# Patient Record
Sex: Male | Born: 1955 | Race: White | Hispanic: No | Marital: Married | Smoking: Never smoker
Health system: Southern US, Community
[De-identification: ages and names within clinical notes are randomized; demographics above are authoritative.]

## PROBLEM LIST (undated history)

## (undated) DIAGNOSIS — Z8719 Personal history of other diseases of the digestive system: Secondary | ICD-10-CM

## (undated) DIAGNOSIS — C801 Malignant (primary) neoplasm, unspecified: Secondary | ICD-10-CM

## (undated) DIAGNOSIS — Z9889 Other specified postprocedural states: Secondary | ICD-10-CM

## (undated) DIAGNOSIS — K219 Gastro-esophageal reflux disease without esophagitis: Secondary | ICD-10-CM

## (undated) DIAGNOSIS — K222 Esophageal obstruction: Secondary | ICD-10-CM

## (undated) HISTORY — PX: PROSTATECTOMY: SHX69

## (undated) SURGERY — EGD (ESOPHAGOGASTRODUODENOSCOPY)
Anesthesia: Moderate Sedation

---
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
## 1978-10-17 HISTORY — PX: HERNIA REPAIR: SHX51

## 1997-10-30 ENCOUNTER — Encounter: Payer: Self-pay | Admitting: Gastroenterology

## 1997-10-30 ENCOUNTER — Ambulatory Visit (HOSPITAL_COMMUNITY): Admission: RE | Admit: 1997-10-30 | Discharge: 1997-10-30 | Payer: Self-pay | Admitting: Gastroenterology

## 1997-11-13 ENCOUNTER — Encounter: Payer: Self-pay | Admitting: Gastroenterology

## 1997-11-13 ENCOUNTER — Ambulatory Visit (HOSPITAL_COMMUNITY): Admission: RE | Admit: 1997-11-13 | Discharge: 1997-11-13 | Payer: Self-pay | Admitting: Gastroenterology

## 2000-04-07 ENCOUNTER — Ambulatory Visit (HOSPITAL_COMMUNITY): Admission: RE | Admit: 2000-04-07 | Discharge: 2000-04-07 | Payer: Self-pay | Admitting: Gastroenterology

## 2000-04-07 ENCOUNTER — Encounter: Payer: Self-pay | Admitting: Gastroenterology

## 2003-01-04 ENCOUNTER — Emergency Department (HOSPITAL_COMMUNITY): Admission: EM | Admit: 2003-01-04 | Discharge: 2003-01-04 | Payer: Self-pay | Admitting: Emergency Medicine

## 2006-05-16 ENCOUNTER — Ambulatory Visit (HOSPITAL_COMMUNITY): Admission: RE | Admit: 2006-05-16 | Discharge: 2006-05-16 | Payer: Self-pay | Admitting: Gastroenterology

## 2008-09-04 ENCOUNTER — Ambulatory Visit (HOSPITAL_COMMUNITY): Admission: RE | Admit: 2008-09-04 | Discharge: 2008-09-04 | Payer: Self-pay | Admitting: Gastroenterology

## 2008-10-15 ENCOUNTER — Ambulatory Visit (HOSPITAL_COMMUNITY): Admission: RE | Admit: 2008-10-15 | Discharge: 2008-10-15 | Payer: Self-pay | Admitting: Gastroenterology

## 2008-11-22 ENCOUNTER — Ambulatory Visit (HOSPITAL_COMMUNITY): Admission: RE | Admit: 2008-11-22 | Discharge: 2008-11-22 | Payer: Self-pay | Admitting: Urology

## 2009-01-15 HISTORY — PX: OTHER SURGICAL HISTORY: SHX169

## 2009-01-22 ENCOUNTER — Encounter (INDEPENDENT_AMBULATORY_CARE_PROVIDER_SITE_OTHER): Payer: Self-pay | Admitting: Urology

## 2009-01-22 ENCOUNTER — Inpatient Hospital Stay (HOSPITAL_COMMUNITY): Admission: RE | Admit: 2009-01-22 | Discharge: 2009-01-23 | Payer: Self-pay | Admitting: Urology

## 2010-05-19 LAB — URINALYSIS, ROUTINE W REFLEX MICROSCOPIC
Hgb urine dipstick: NEGATIVE
Nitrite: NEGATIVE
Protein, ur: NEGATIVE mg/dL
Urobilinogen, UA: 0.2 mg/dL (ref 0.0–1.0)

## 2010-05-19 LAB — CBC
HCT: 44 % (ref 39.0–52.0)
Hemoglobin: 12.9 g/dL — ABNORMAL LOW (ref 13.0–17.0)
Hemoglobin: 13.9 g/dL (ref 13.0–17.0)
MCHC: 31.6 g/dL (ref 30.0–36.0)
MCHC: 31.9 g/dL (ref 30.0–36.0)
MCV: 77.2 fL — ABNORMAL LOW (ref 78.0–100.0)
MCV: 77.3 fL — ABNORMAL LOW (ref 78.0–100.0)
Platelets: 274 10*3/uL (ref 150–400)
Platelets: 285 10*3/uL (ref 150–400)
RBC: 5.23 MIL/uL (ref 4.22–5.81)
RDW: 17.2 % — ABNORMAL HIGH (ref 11.5–15.5)
RDW: 17.6 % — ABNORMAL HIGH (ref 11.5–15.5)

## 2010-05-19 LAB — ABO/RH: ABO/RH(D): O NEG

## 2010-05-19 LAB — COMPREHENSIVE METABOLIC PANEL
Alkaline Phosphatase: 67 U/L (ref 39–117)
BUN: 9 mg/dL (ref 6–23)
Calcium: 9.1 mg/dL (ref 8.4–10.5)
Creatinine, Ser: 1.06 mg/dL (ref 0.4–1.5)
Glucose, Bld: 99 mg/dL (ref 70–99)
Total Protein: 6.5 g/dL (ref 6.0–8.3)

## 2010-05-19 LAB — BASIC METABOLIC PANEL
BUN: 5 mg/dL — ABNORMAL LOW (ref 6–23)
CO2: 29 mEq/L (ref 19–32)
Calcium: 8.4 mg/dL (ref 8.4–10.5)
Chloride: 105 mEq/L (ref 96–112)
Creatinine, Ser: 1.03 mg/dL (ref 0.4–1.5)
GFR calc Af Amer: >60 mL/min (ref >60)
GFR calc non Af Amer: >60 mL/min (ref >60)
Glucose, Bld: 116 mg/dL — ABNORMAL HIGH (ref 70–99)
Sodium: 139 mEq/L (ref 135–145)

## 2010-05-19 LAB — DIFFERENTIAL
Basophils Absolute: 0 10*3/uL (ref 0.0–0.1)
Basophils Relative: 0 % (ref 0–1)
Eosinophils Absolute: 0 10*3/uL (ref 0.0–0.7)
Eosinophils Relative: 0 % (ref 0–5)
Lymphocytes Relative: 15 % (ref 12–46)
Monocytes Absolute: 0.1 10*3/uL (ref 0.1–1.0)
Monocytes Relative: 2 % — ABNORMAL LOW (ref 3–12)
Neutro Abs: 5.7 10*3/uL (ref 1.7–7.7)

## 2010-05-19 LAB — PROTIME-INR
INR: 1.02 (ref 0.00–1.49)
Prothrombin Time: 13.3 seconds (ref 11.6–15.2)

## 2010-05-19 LAB — CREATININE, FLUID (PLEURAL, PERITONEAL, JP DRAINAGE): Creat, Fluid: 1 mg/dL

## 2010-05-19 LAB — APTT: aPTT: 29 seconds (ref 24–37)

## 2010-06-30 NOTE — Op Note (Signed)
NAMEKAELOB, PERSKY               ACCOUNT NO.:  [id number]   MEDICAL RECORD NO.:  [id number]          PATIENT TYPE:  AMB   LOCATION:  ENDO                         FACILITY:  [facility]   PHYSICIAN:  James L. Malon Kindle., M.D.DATE OF BIRTH:  25-Apr-1955   DATE OF PROCEDURE:  10/15/2008  DATE OF DISCHARGE:                               OPERATIVE REPORT   PROCEDURE:  Esophagogastroduodenoscopy and esophageal dilatation.   MEDICATIONS:  Cetacaine spray, fentanyl 100 mcg, Versed 9 mg IV.   INDICATIONS:  The patient has had a history of peptic stricture.  He was  last dilated to 12 mm with some heme.  He has been doing better.  We  decided to go ahead and repeat the dilatation.  He has been on Prilosec.   DESCRIPTION OF PROCEDURE:  Procedure including the risks and benefits  were discussed again with the patient.  No fluoroscopy unit on the Endo  unit. The patient was sedated and the Pentax upper scope the adult size  was inserted blindly into the esophagus and advanced into the esophagus.  There was a stricture in the distal esophagus.  With gentle pressure we  were able to pass and the complete endoscopy was performed.  The  duodenum including the bulb and second portion were seen well.  The  pyloric channel, antrum and body were normal.  Fundus and cardia seen  well on retroflexed view.  The scope was withdrawn and then advanced  back down to the pyloric area.  The Savary guidewire was placed through  the scope in the scope withdrawn over the guide wire.  Unfortunately the  fluoroscopy unit was not working and they had go get another fluoroscopy  unit which they were able to obtain and then we went ahead with the  dilatation under fluoroscopic control.  A total of 36 seconds of fluoro  time was used.  We passed the 12, 12.8, 14 and 15 mm Savary dilator  using fluoroscopic guidance to observe that it crossed the diaphragm.  There was gentle resistance with the 15 but no heme.  At this point,  the  dilator and guidewire withdrawn as a unit.  The patient tolerated the  procedure well.  There were no immediate complications.   ASSESSMENT:  Esophageal stricture dilated to 15 mm.   PLAN:  Routine post dilatation orders.  Will recommend repeating  procedure p.r.n. will have him remain on Prilosec 20 mg daily  indefinitely.           ______________________________  Llana Aliment Malon Kindle., M.D.     Waldron Session  D:  10/15/2008  T:  10/15/2008  Job:  [id number]   cc:   Armstead Bouche, M.D.  Fax: [phone]

## 2010-06-30 NOTE — Op Note (Signed)
NAMECLEON, SIGNORELLI               ACCOUNT NO.:  [id number]   MEDICAL RECORD NO.:  [id number]          PATIENT TYPE:  AMB   LOCATION:  ENDO                         FACILITY:  [facility]   PHYSICIAN:  James L. Malon Kindle., M.D.DATE OF BIRTH:  Mar 09, 1955   DATE OF PROCEDURE:  09/04/2008  DATE OF DISCHARGE:                               OPERATIVE REPORT   PROCEDURE:  Esophagoscopy with Savary dilatation using fluoroscopic  guidance.   MEDICATIONS:  Cetacaine spray, fentanyl 60 mcg, Versed 6 mg IV.   INDICATIONS:  The patient has had previous esophageal stricture dilated  in 2008.  He has had problems swallowing.  He had stopped his proton  pump inhibitor.   DESCRIPTION OF PROCEDURE:  The procedure had been explained to the  patient and consent obtained.  In the left lateral decubitus position in  the endoscopy unit, the Pentax scope was passed.  We reached the distal  esophagus.  There was a tight stricture and we were unable to pass the  scope.  Using fluoroscopic guidance, a Savary guidewire was placed.  It  was seen to go along the greater curve of the stomach.  We then dilated  with the 10-mm Savary dilator.  The guidewire apparently had gotten  pulled back due to kinks in the guidewire, so the dilator was removed  and I re-inserted the scope back to the stricture.  There was a slight  amount of heme.  We passed again the Savary wire through the stricture  into the stomach using fluoroscopic guidance.  Then in a sequential  manner, I passed an 11 and 12-mm with slight heme on the 12-mm dilator.  The 12-mm dilator and guidewire were removed as a unit.  The patient  tolerated the procedure well.  There were no immediate complications.   ASSESSMENT:  Esophageal stricture dilated to 12 mm.   PLAN:  Routine post-dilatation orders.  Will recommend daily omeprazole  and repeat procedure in 2 to 3 weeks.           ______________________________  Llana Aliment Malon Kindle., M.D.     Waldron Session  D:  09/04/2008  T:  09/05/2008  Job:  [id number]   cc:   Melida Quitter, M.D.  Fax: [phone]

## 2010-07-03 NOTE — Procedures (Signed)
[facility]  Patient:    Curtis Dorsey, Curtis Dorsey                      MRN: [id number] Proc. Date: 04/07/00 Adm. Date:  [id number] Attending:  Orland Mustard CC:         Arsenio Loader, M.D.   Procedure Report  PROCEDURES PERFORMED:  Esophagoscopy with Savary dilatation.  INDICATIONS:  Nice gentleman with previous history of a stricture dilated essentially three years ago, now has not had any heartburn or been on an acid suppression, but has begun to have increasing symptoms of dysphagia. For this reason, a repeat dilation is arranged. The patient was informed again regarding the procedure and the potentials and risks and benefits were discussed briefly, again, prior to the procedure.  DESCRIPTION OF PROCEDURE:  The procedure was performed in the fluoroscopy unit and the Olympus adult videoscope was inserted and advanced under direct visualization. The distal esophagus was reached; there was a stricture that the scope would not pass with gentle pressure. The area was fairly inflamed but was not frankly ulcerated. The scope would not pass, so I went ahead and under fluoroscopic guidance passed the Savary guidewire into the stomach. We then withdrew the scope using fluoroscopic guidance. I then passed a #33, #36, #39 and #42 [place] Savary dilators in the usual manner with the patients head extended. There was some heme with the #42 [place] dilator. The scope was withdrawn. The patient tolerated the procedure well. There were no immediate complications.  ASSESSMENT:  Esophageal stricture successfully dilated to #42 [place].  PLAN:  Will start him on acid reduction with Aciphex. We will see him back in the office in three months. Will give routine post-dilatation instructions with instructions to call for chest pain, shortness of breath, etc. DD:  04/07/00 TD:  04/08/00 Job: [id number]

## 2010-07-03 NOTE — Op Note (Signed)
Curtis Dorsey, Curtis Dorsey               ACCOUNT NO.:  [id number]   MEDICAL RECORD NO.:  [id number]          PATIENT TYPE:  AMB   LOCATION:  ENDO                         FACILITY:  [facility]   PHYSICIAN:  James L. Malon Kindle., M.D.DATE OF BIRTH:  1955/06/14   DATE OF PROCEDURE:  05/16/2006  DATE OF DISCHARGE:                               OPERATIVE REPORT   PROCEDURE:  Esophagoscopy with dilation of esophageal stricture.   MEDICATIONS:  Fentanyl 75 mcg, Versed 7.5 mg IV.   INDICATIONS:  A gentleman who has been dilated several times in the  past.  The last dilatation was to 80 [place] in 2002.  He is not on any  medicine for acid.  He has peptic stricture.  He has not had any reflux  and is having problems swallowing.  Called up and dilation was set up.   DESCRIPTION OF PROCEDURE:  The procedure, including the risks and  benefits, were discussed with him again.  He has had it done several  times previously.  This performed in the endoscopy unit using  fluoroscopic guidance.  The Pentax scope was inserted and passed.  The  distal esophagus was reached.  There was a stricture.  It was quite  tight.  The scope would not pass at all.  I then, using fluoroscopic  guidance, passed a Savary guide wire through the stricture and it was  seen to lay along the greater curve in the stomach.  We then withdrew  the scope over the guide wire.  We then extended the patient's neck and  started with a 24-French Savary dilator since I did not know quite how  tight this stricture was.  This was passed under fluoroscopic guidance  in the usual manner.  In a like manner 27, 30, 33, and 36-French  dilators were passed.  There was no __________ , but some resistance  felt with a 36-French dilator.   ASSESSMENT:  Esophageal stricture, probably peptic stricture, dilated to  36 [place].   PLAN:  Will start the patient on omeprazole daily and will see back in  the office in 6 weeks.  He likely will need further  dilatation.           ______________________________  Llana Aliment Malon Kindle., M.D.     Waldron Session  D:  05/16/2006  T:  05/16/2006  Job:  [id number]   cc:   Stacie Acres. Cliffton Asters, M.D.

## 2012-07-06 ENCOUNTER — Encounter (INDEPENDENT_AMBULATORY_CARE_PROVIDER_SITE_OTHER): Payer: Self-pay | Admitting: Surgery

## 2012-07-06 ENCOUNTER — Ambulatory Visit (INDEPENDENT_AMBULATORY_CARE_PROVIDER_SITE_OTHER): Payer: 59 | Admitting: Surgery

## 2012-07-06 VITALS — BP 132/78 | HR 68 | Temp 97.8°F | Resp 18 | Ht 72.0 in | Wt 195.6 lb

## 2012-07-06 DIAGNOSIS — K4090 Unilateral inguinal hernia, without obstruction or gangrene, not specified as recurrent: Secondary | ICD-10-CM

## 2012-07-06 DIAGNOSIS — Z8546 Personal history of malignant neoplasm of prostate: Secondary | ICD-10-CM

## 2012-07-06 NOTE — Patient Instructions (Signed)

## 2012-07-06 NOTE — Progress Notes (Signed)
Chief Complaint:  Symptomatic right inguinal hernia  History of Present Illness:  Curtis Dorsey is an 57 y.o. male on whom I have previously performed a left inguinal hernia in the 1980s and he presented to [facility] in 2004 with a new right inguinal hernia.  I discussed laparoscopic repair at that time. He subsequently had a robotic prostatectomy by Dr. Gaynelle Arabian and 2010. He has done well from that. We discussed repair; the the pre-peritoneal space had been dissected for his prostatectomy and I think that for that reason a better repair could be offered him anteriorly. I would therefore propose an open right inguinal hernia with mesh. I explained the procedure to him and gave him a booklet on this. He is aware of the risks and has gone through the same procedure on the left side. I will try to set this up over [facility] under general anesthesia.  No past medical history on file.  Past Surgical History  Procedure Laterality Date  . Hernia repair  1980 s    Dr. Daphine Deutscher  . 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 Monico Blitz onc  . Tonsillectomy and adenoidectomy  1963    Current Outpatient Prescriptions  Medication Sig Dispense Refill  . ibuprofen (ADVIL,MOTRIN) 200 MG tablet Take 200 mg by mouth every 6 (six) hours as needed for pain.      Marland Kitchen omeprazole (PRILOSEC) 20 MG capsule Take 20 mg by mouth daily.      . phenylephrine (SUDAFED PE) 10 MG TABS Take 10 mg by mouth every 4 (four) hours as needed.       No current facility-administered medications for this visit.   Review of patient's allergies indicates no known allergies. Family History  Problem Relation Age of Onset  . Cancer Mother     colohn ca  . Parkinson's disease Father   . Cancer Paternal Grandmother     colon ca   Social History:   has no tobacco, alcohol, and drug history on file.   REVIEW OF SYSTEMS - PERTINENT POSITIVES ONLY: Otherwise healthy  Physical Exam:   Blood pressure 132/78, pulse 68, temperature 97.8 F  (36.6 C), resp. rate 18, height 6' (1.829 m), weight 195 lb 9.6 oz (88.724 kg). Body mass index is 26.52 kg/(m^2).  Gen:  WDWN WM NAD  Neurological: Alert and oriented to person, place, and time. Motor and sensory function is grossly intact  Head: Normocephalic and atraumatic.  Eyes: Conjunctivae are normal. Pupils are equal, round, and reactive to light. No scleral icterus.  Neck: Normal range of motion. Neck supple. No tracheal deviation or thyromegaly present.  Cardiovascular:  SR without murmurs or gallops.  No carotid bruits Respiratory: Effort normal.  No respiratory distress. No chest wall tenderness. Breath sounds normal.  No wheezes, rales or rhonchi.  Abdomen:  Prominent RIH.  No left inguinal hernia GU: Musculoskeletal: Normal range of motion. Extremities are nontender. No cyanosis, edema or clubbing noted Lymphadenopathy: No cervical, preauricular, postauricular or axillary adenopathy is present Skin: Skin is warm and dry. No rash noted. No diaphoresis. No erythema. No pallor. Pscyh: Normal mood and affect. Behavior is normal. Judgment and thought content normal.   LABORATORY RESULTS: No results found for this or any previous visit (from the past 48 hour(s)).  RADIOLOGY RESULTS: No results found.  Problem List: There are no active problems to display for this patient.   Assessment & Plan: Right inguinal hernia.  Plan open RIH with mesh at  CDS    Matt B. Daphine Deutscher, MD, [facility] [phone] beeper [phone]  07/06/2012 10:35 AM

## 2012-09-01 ENCOUNTER — Ambulatory Visit (HOSPITAL_COMMUNITY)
Admission: RE | Admit: 2012-09-01 | Discharge: 2012-09-01 | Disposition: A | Discharge status: Home / Self Care | Payer: 59 | Source: Ambulatory Visit | Attending: Gastroenterology | Admitting: Gastroenterology

## 2012-09-01 ENCOUNTER — Ambulatory Visit (HOSPITAL_COMMUNITY): Payer: 59

## 2012-09-01 ENCOUNTER — Encounter (HOSPITAL_COMMUNITY): Payer: Self-pay | Admitting: *Deleted

## 2012-09-01 ENCOUNTER — Other Ambulatory Visit: Payer: Self-pay | Admitting: Gastroenterology

## 2012-09-01 ENCOUNTER — Encounter (HOSPITAL_COMMUNITY)
Admission: RE | Disposition: A | Discharge status: Home / Self Care | Payer: Self-pay | Source: Ambulatory Visit | Attending: Gastroenterology

## 2012-09-01 DIAGNOSIS — Z87891 Personal history of nicotine dependence: Secondary | ICD-10-CM | POA: Insufficient documentation

## 2012-09-01 DIAGNOSIS — Z800 Family history of malignant neoplasm of digestive organs: Secondary | ICD-10-CM | POA: Insufficient documentation

## 2012-09-01 DIAGNOSIS — K222 Esophageal obstruction: Secondary | ICD-10-CM | POA: Insufficient documentation

## 2012-09-01 DIAGNOSIS — Z8546 Personal history of malignant neoplasm of prostate: Secondary | ICD-10-CM | POA: Insufficient documentation

## 2012-09-01 DIAGNOSIS — Z9079 Acquired absence of other genital organ(s): Secondary | ICD-10-CM | POA: Insufficient documentation

## 2012-09-01 DIAGNOSIS — K219 Gastro-esophageal reflux disease without esophagitis: Secondary | ICD-10-CM | POA: Insufficient documentation

## 2012-09-01 HISTORY — DX: Other specified postprocedural states: Z98.890

## 2012-09-01 HISTORY — DX: Esophageal obstruction: K22.2

## 2012-09-01 HISTORY — DX: Gastro-esophageal reflux disease without esophagitis: K21.9

## 2012-09-01 HISTORY — DX: Malignant (primary) neoplasm, unspecified: C80.1

## 2012-09-01 HISTORY — PX: ESOPHAGOGASTRODUODENOSCOPY: SHX5428

## 2012-09-01 HISTORY — PX: SAVORY DILATION: SHX5439

## 2012-09-01 HISTORY — DX: Personal history of other diseases of the digestive system: Z87.19

## 2012-09-01 SURGERY — EGD (ESOPHAGOGASTRODUODENOSCOPY)
Anesthesia: Moderate Sedation

## 2012-09-01 MED ORDER — SODIUM CHLORIDE 0.9 % IV SOLN: INTRAVENOUS | Status: DC

## 2012-09-01 MED ORDER — MIDAZOLAM HCL 10 MG/2ML IJ SOLN
INTRAMUSCULAR | Status: DC | PRN
  Administered 2012-09-01 (×4): 2 mg via INTRAVENOUS

## 2012-09-01 MED ORDER — FENTANYL CITRATE 0.05 MG/ML IJ SOLN
INTRAMUSCULAR | Status: AC
  Filled 2012-09-01: qty 4

## 2012-09-01 MED ORDER — DIPHENHYDRAMINE HCL 50 MG/ML IJ SOLN
INTRAMUSCULAR | Status: DC | PRN
  Administered 2012-09-01: 25 mg via INTRAVENOUS

## 2012-09-01 MED ORDER — MIDAZOLAM HCL 10 MG/2ML IJ SOLN
INTRAMUSCULAR | Status: AC
  Filled 2012-09-01: qty 4

## 2012-09-01 MED ORDER — DIPHENHYDRAMINE HCL 50 MG/ML IJ SOLN
INTRAMUSCULAR | Status: AC
  Filled 2012-09-01: qty 1

## 2012-09-01 MED ORDER — FENTANYL CITRATE 0.05 MG/ML IJ SOLN
INTRAMUSCULAR | Status: DC | PRN
  Administered 2012-09-01 (×4): 25 ug via INTRAVENOUS

## 2012-09-01 MED ORDER — BUTAMBEN-TETRACAINE-BENZOCAINE 2-2-14 % EX AERO
INHALATION_SPRAY | CUTANEOUS | Status: DC | PRN
  Administered 2012-09-01: 2 via TOPICAL

## 2012-09-01 NOTE — H&P (Signed)
  Subjective:   Patient is a 57 y.o. male presents with worsening dysphagia. He has had a previous history of esophageal stricture is dilated about 4 years ago.. Procedure including risks and benefits discussed in office.  Patient Active Problem List   Diagnosis Date Noted  . H/O prostate cancer-post robotic prostatectomy 2010 07/06/2012   Past Medical History  Diagnosis Date  . GERD (gastroesophageal reflux disease)   . H/O hiatal hernia   . Cancer     prostate 2010  . Esophageal stricture   . Hx of inguinal hernia surgery     right; will have surgery 09/13/2012    Past Surgical History  Procedure Laterality Date  . 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 Monico Blitz onc, removed   . Tonsillectomy and adenoidectomy  1963  . Hernia repair  1980 s    Dr. Daphine Deutscher  . Prostatectomy      Prescriptions prior to admission  Medication Sig Dispense Refill  . ibuprofen (ADVIL,MOTRIN) 200 MG tablet Take 200 mg by mouth every 6 (six) hours as needed for pain.      . magnesium 30 MG tablet Take 30 mg by mouth as needed.      Marland Kitchen omeprazole (PRILOSEC) 20 MG capsule Take 20 mg by mouth daily.      . phenylephrine (SUDAFED PE) 10 MG TABS Take 10 mg by mouth every 4 (four) hours as needed.       No Known Allergies  History  Substance Use Topics  . Smoking status: Former Games developer  . Smokeless tobacco: Never Used  . Alcohol Use: Yes     Comment: 3-4 drinks a day    Family History  Problem Relation Age of Onset  . Cancer Mother     colohn ca  . Parkinson's disease Father   . Cancer Paternal Grandmother     colon ca     Objective:   Patient Vitals for the past 8 hrs:  BP Temp Temp src Resp SpO2 Height Weight  09/01/12 1203 137/98 mmHg 98.2 F (36.8 C) Oral 13 98 % 6' (1.829 m) 87.544 kg (193 lb)         See MD Preop evaluation      Assessment:   1. Dysphagia probably due to esophageal stricture  Plan:   1. Will plan EGD and Savary dilatation. The procedures been discussed with the  patient in detail in the office including the risk and benefits.

## 2012-09-01 NOTE — Op Note (Signed)
[facility] [address] [place], [address]   ENDOSCOPY PROCEDURE REPORT  PATIENT: Curtis, Dorsey  MR#: [id number] BIRTHDATE: December 20, 1955 , 57  yrs. old GENDER: Male ENDOSCOPIST:Ritaj Dullea Randa Evens, MD REFERRED BY: Dr. Leonides Sake PROCEDURE DATE:  09/01/2012 PROCEDURE:      Esophagoscopy with Gaspar Bidding Dilation ASA CLASS:    class 2 INDICATIONS:   worsening dysphagia with history of prior dilation of esophageal stricture MEDICATION:   Benadryl 25 mg, fentanyl 100 mcg, versed 8 mg IV TOPICAL ANESTHETIC:   cetacaine spray  DESCRIPTION OF PROCEDURE:   ThePentax upper endoscope was inserted into the esophagus with swallowing. We advanced into the distal esophagus and there was a fairly tight esophageal stricture. Endoscope would not pass. We then passed a Savary wire through the stricture into the stomach with fluoroscopic guidance. The scope was withdrawn over the guidewire. The patient's neck was an extended and using fluoroscopic guidance to make sure that the guidewire did not move, I passed 12.8 mm, 14 mm, 15 mm, and 16mm dilator's. Small amount of heme was seen on the 16mm dilator in the dilator and guidewire were withdrawn. 1.3 minutes of flouro time was used. There were no immediate complications     COMPLICATIONS: None  ENDOSCOPIC IMPRESSION: 1. Esophageal stricture. Dilated to 16 mm  RECOMMENDATIONS: 1. Routine post dilatation orders 2.We'll continue current medications .3. We will repeat, PRN basis    _______________________________ eSigned:  Carman Ching, MD 09/01/2012 1:48 PM

## 2012-09-04 ENCOUNTER — Encounter (HOSPITAL_COMMUNITY): Payer: Self-pay | Admitting: Gastroenterology

## 2012-09-04 ENCOUNTER — Encounter (HOSPITAL_COMMUNITY): Payer: Self-pay | Admitting: Pharmacy Technician

## 2012-09-05 NOTE — Patient Instructions (Signed)
Curtis Dorsey  09/05/2012   Your procedure is scheduled on:  09/13/12              Surgery 0830am-1005am  Report to [facility] at    0630 AM.  Call this number if you have problems the morning of surgery: [phone]   Remember:   Do not eat food or drink liquids after midnight.   Take these medicines the morning of surgery with A SIP OF WATER:    Do not wear jewelry,  Do not wear lotions, powders, or perfumes.  . Men may shave face and neck.  Do not bring valuables to the hospital.  Contacts, dentures or bridgework may not be worn into surgery.     Patients discharged the day of surgery will not be allowed to drive  home.  Name and phone number of your driver:    SEE CHG INSTRUCTION SHEET    Please read over the following fact sheets that you were given:  coughing and deep breathing exercises, leg exercises               Failure to comply with these instructions may result in cancellation of your surgery.                Patient Signature ____________________________              Nurse Signature _____________________________

## 2012-09-06 ENCOUNTER — Encounter (HOSPITAL_COMMUNITY)
Admission: RE | Admit: 2012-09-06 | Discharge: 2012-09-06 | Disposition: A | Discharge status: Home / Self Care | Payer: 59 | Source: Ambulatory Visit | Attending: Surgery | Admitting: Surgery

## 2012-09-06 ENCOUNTER — Encounter (HOSPITAL_COMMUNITY): Payer: Self-pay

## 2012-09-06 DIAGNOSIS — Z01812 Encounter for preprocedural laboratory examination: Secondary | ICD-10-CM | POA: Insufficient documentation

## 2012-09-06 DIAGNOSIS — K4090 Unilateral inguinal hernia, without obstruction or gangrene, not specified as recurrent: Secondary | ICD-10-CM | POA: Insufficient documentation

## 2012-09-06 LAB — CBC
Hemoglobin: 15.5 g/dL (ref 13.0–17.0)
MCHC: 32.6 g/dL (ref 30.0–36.0)
Platelets: 285 10*3/uL (ref 150–400)
RDW: 16 % — ABNORMAL HIGH (ref 11.5–15.5)

## 2012-09-13 ENCOUNTER — Ambulatory Visit (HOSPITAL_COMMUNITY)
Admission: RE | Admit: 2012-09-13 | Discharge: 2012-09-13 | Disposition: A | Discharge status: Home / Self Care | Payer: 59 | Source: Ambulatory Visit | Attending: Surgery | Admitting: Surgery

## 2012-09-13 ENCOUNTER — Ambulatory Visit (HOSPITAL_COMMUNITY): Payer: 59 | Admitting: Anesthesiology

## 2012-09-13 ENCOUNTER — Encounter (HOSPITAL_COMMUNITY): Payer: Self-pay | Admitting: Anesthesiology

## 2012-09-13 ENCOUNTER — Encounter (HOSPITAL_COMMUNITY): Payer: Self-pay | Admitting: *Deleted

## 2012-09-13 ENCOUNTER — Encounter (HOSPITAL_COMMUNITY)
Admission: RE | Disposition: A | Discharge status: Home / Self Care | Payer: Self-pay | Source: Ambulatory Visit | Attending: Surgery

## 2012-09-13 DIAGNOSIS — Z8546 Personal history of malignant neoplasm of prostate: Secondary | ICD-10-CM | POA: Insufficient documentation

## 2012-09-13 DIAGNOSIS — Z79899 Other long term (current) drug therapy: Secondary | ICD-10-CM | POA: Insufficient documentation

## 2012-09-13 DIAGNOSIS — K4090 Unilateral inguinal hernia, without obstruction or gangrene, not specified as recurrent: Secondary | ICD-10-CM

## 2012-09-13 DIAGNOSIS — Z8719 Personal history of other diseases of the digestive system: Secondary | ICD-10-CM

## 2012-09-13 HISTORY — PX: INGUINAL HERNIA REPAIR: SHX194

## 2012-09-13 SURGERY — REPAIR, HERNIA, INGUINAL, ADULT
Anesthesia: General | Site: Groin | Laterality: Right | Wound class: Clean

## 2012-09-13 MED ORDER — BUPIVACAINE LIPOSOME 1.3 % IJ SUSP
20.0000 mL | Freq: Once | INTRAMUSCULAR | Status: DC
  Filled 2012-09-13: qty 20

## 2012-09-13 MED ORDER — LACTATED RINGERS IV SOLN
INTRAVENOUS | Status: DC | PRN
  Administered 2012-09-13 (×2): via INTRAVENOUS

## 2012-09-13 MED ORDER — SODIUM CHLORIDE 0.9 % IJ SOLN: 3.0000 mL | INTRAMUSCULAR | Status: DC | PRN

## 2012-09-13 MED ORDER — HYDROMORPHONE HCL PF 1 MG/ML IJ SOLN
0.2500 mg | INTRAMUSCULAR | Status: DC | PRN
  Administered 2012-09-13 (×2): 0.5 mg via INTRAVENOUS

## 2012-09-13 MED ORDER — BUPIVACAINE LIPOSOME 1.3 % IJ SUSP
INTRAMUSCULAR | Status: DC | PRN
  Administered 2012-09-13: 20 mL

## 2012-09-13 MED ORDER — 0.9 % SODIUM CHLORIDE (POUR BTL) OPTIME
TOPICAL | Status: DC | PRN
  Administered 2012-09-13: 1000 mL

## 2012-09-13 MED ORDER — CEFAZOLIN SODIUM-DEXTROSE 2-3 GM-% IV SOLR
INTRAVENOUS | Status: AC
  Filled 2012-09-13: qty 50

## 2012-09-13 MED ORDER — PROPOFOL 10 MG/ML IV BOLUS
INTRAVENOUS | Status: DC | PRN
  Administered 2012-09-13: 200 mg via INTRAVENOUS

## 2012-09-13 MED ORDER — CHLORHEXIDINE GLUCONATE 4 % EX LIQD
1.0000 "application " | Freq: Once | CUTANEOUS | Status: DC
  Filled 2012-09-13: qty 15

## 2012-09-13 MED ORDER — MIDAZOLAM HCL 5 MG/5ML IJ SOLN
INTRAMUSCULAR | Status: DC | PRN
  Administered 2012-09-13: 2 mg via INTRAVENOUS

## 2012-09-13 MED ORDER — ACETAMINOPHEN 650 MG RE SUPP
650.0000 mg | RECTAL | Status: DC | PRN
  Filled 2012-09-13: qty 1

## 2012-09-13 MED ORDER — ACETAMINOPHEN 325 MG PO TABS: 650.0000 mg | ORAL_TABLET | ORAL | Status: DC | PRN

## 2012-09-13 MED ORDER — ONDANSETRON HCL 4 MG/2ML IJ SOLN
INTRAMUSCULAR | Status: DC | PRN
  Administered 2012-09-13: 4 mg via INTRAVENOUS

## 2012-09-13 MED ORDER — OXYCODONE HCL 5 MG PO TABS
5.0000 mg | ORAL_TABLET | ORAL | Status: DC | PRN
  Administered 2012-09-13: 5 mg via ORAL
  Filled 2012-09-13: qty 1

## 2012-09-13 MED ORDER — LIDOCAINE HCL (CARDIAC) 20 MG/ML IV SOLN
INTRAVENOUS | Status: DC | PRN
  Administered 2012-09-13: 100 mg via INTRAVENOUS

## 2012-09-13 MED ORDER — FENTANYL CITRATE 0.05 MG/ML IJ SOLN
INTRAMUSCULAR | Status: DC | PRN
  Administered 2012-09-13: 100 ug via INTRAVENOUS

## 2012-09-13 MED ORDER — PROMETHAZINE HCL 25 MG/ML IJ SOLN: 6.2500 mg | INTRAMUSCULAR | Status: DC | PRN

## 2012-09-13 MED ORDER — HEPARIN SODIUM (PORCINE) 5000 UNIT/ML IJ SOLN
5000.0000 [IU] | Freq: Once | INTRAMUSCULAR | Status: AC
  Administered 2012-09-13: 5000 [IU] via SUBCUTANEOUS
  Filled 2012-09-13: qty 1

## 2012-09-13 MED ORDER — SODIUM CHLORIDE 0.9 % IV SOLN: 250.0000 mL | INTRAVENOUS | Status: DC | PRN

## 2012-09-13 MED ORDER — CEFAZOLIN SODIUM-DEXTROSE 2-3 GM-% IV SOLR
2.0000 g | INTRAVENOUS | Status: AC
  Administered 2012-09-13: 2 g via INTRAVENOUS

## 2012-09-13 MED ORDER — MORPHINE SULFATE 10 MG/ML IJ SOLN: 1.0000 mg | INTRAMUSCULAR | Status: DC | PRN

## 2012-09-13 MED ORDER — OXYCODONE-ACETAMINOPHEN 5-325 MG PO TABS: 1.0000 | ORAL_TABLET | ORAL | Status: DC | PRN

## 2012-09-13 MED ORDER — SODIUM CHLORIDE 0.9 % IJ SOLN: 3.0000 mL | Freq: Two times a day (BID) | INTRAMUSCULAR | Status: DC

## 2012-09-13 MED ORDER — ONDANSETRON HCL 4 MG/2ML IJ SOLN: 4.0000 mg | Freq: Four times a day (QID) | INTRAMUSCULAR | Status: DC | PRN

## 2012-09-13 MED ORDER — HYDROMORPHONE HCL PF 1 MG/ML IJ SOLN
INTRAMUSCULAR | Status: AC
  Filled 2012-09-13: qty 1

## 2012-09-13 MED ORDER — DEXAMETHASONE SODIUM PHOSPHATE 10 MG/ML IJ SOLN
INTRAMUSCULAR | Status: DC | PRN
  Administered 2012-09-13: 10 mg via INTRAVENOUS

## 2012-09-13 SURGICAL SUPPLY — 45 items
ADH SKN CLS APL DERMABOND .7 (GAUZE/BANDAGES/DRESSINGS) ×1
APL SKNCLS STERI-STRIP NONHPOA (GAUZE/BANDAGES/DRESSINGS) ×1
BENZOIN TINCTURE PRP APPL 2/3 (GAUZE/BANDAGES/DRESSINGS) ×2 IMPLANT
BLADE HEX COATED 2.75 (ELECTRODE) ×2 IMPLANT
BLADE SURG 15 STRL LF DISP TIS (BLADE) ×1 IMPLANT
BLADE SURG 15 STRL SS (BLADE) ×2
BLADE SURG SZ10 CARB STEEL (BLADE) ×2 IMPLANT
CANISTER SUCTION 2500CC (MISCELLANEOUS) ×2 IMPLANT
CLOTH BEACON ORANGE TIMEOUT ST (SAFETY) ×2 IMPLANT
DECANTER SPIKE VIAL GLASS SM (MISCELLANEOUS) ×2 IMPLANT
DERMABOND ADVANCED (GAUZE/BANDAGES/DRESSINGS) ×1
DERMABOND ADVANCED .7 DNX12 (GAUZE/BANDAGES/DRESSINGS) IMPLANT
DISSECTOR ROUND CHERRY 3/8 STR (MISCELLANEOUS) IMPLANT
DRAIN PENROSE 18X1/2 LTX STRL (DRAIN) ×2 IMPLANT
DRAPE LAPAROTOMY TRNSV 102X78 (DRAPE) ×2 IMPLANT
DRSG TEGADERM 2-3/8X2-3/4 SM (GAUZE/BANDAGES/DRESSINGS) IMPLANT
ELECT REM PT RETURN 9FT ADLT (ELECTROSURGICAL) ×2
ELECTRODE REM PT RTRN 9FT ADLT (ELECTROSURGICAL) ×1 IMPLANT
GAUZE SPONGE 4X4 16PLY XRAY LF (GAUZE/BANDAGES/DRESSINGS) IMPLANT
GLOVE BIOGEL M 8.0 STRL (GLOVE) ×2 IMPLANT
GLOVE BIOGEL PI IND STRL 7.0 (GLOVE) ×1 IMPLANT
GLOVE BIOGEL PI INDICATOR 7.0 (GLOVE) ×1
GOWN STRL NON-REIN LRG LVL3 (GOWN DISPOSABLE) ×2 IMPLANT
GOWN STRL REIN XL XLG (GOWN DISPOSABLE) ×4 IMPLANT
KIT BASIN OR (CUSTOM PROCEDURE TRAY) ×2 IMPLANT
MESH HERNIA 3X6 (Mesh General) ×1 IMPLANT
NDL HYPO 25X1 1.5 SAFETY (NEEDLE) ×1 IMPLANT
NEEDLE HYPO 25X1 1.5 SAFETY (NEEDLE) ×2 IMPLANT
NS IRRIG 1000ML POUR BTL (IV SOLUTION) ×2 IMPLANT
PACK BASIC VI WITH GOWN DISP (CUSTOM PROCEDURE TRAY) ×2 IMPLANT
PENCIL BUTTON HOLSTER BLD 10FT (ELECTRODE) ×2 IMPLANT
SPONGE GAUZE 4X4 12PLY (GAUZE/BANDAGES/DRESSINGS) IMPLANT
SPONGE LAP 4X18 X RAY DECT (DISPOSABLE) ×4 IMPLANT
STAPLER VISISTAT 35W (STAPLE) IMPLANT
STRIP CLOSURE SKIN 1/2X4 (GAUZE/BANDAGES/DRESSINGS) ×2 IMPLANT
SUT PROLENE 2 0 CT2 30 (SUTURE) ×4 IMPLANT
SUT SILK 2 0 SH (SUTURE) ×2 IMPLANT
SUT SILK 2 0 SH CR/8 (SUTURE) IMPLANT
SUT SURGILON 0 BLK (SUTURE) IMPLANT
SUT VIC AB 2-0 SH 27 (SUTURE) ×4
SUT VIC AB 2-0 SH 27X BRD (SUTURE) ×2 IMPLANT
SUT VIC AB 4-0 SH 18 (SUTURE) ×2 IMPLANT
SYR BULB IRRIGATION 50ML (SYRINGE) ×2 IMPLANT
SYR CONTROL 10ML LL (SYRINGE) ×2 IMPLANT
YANKAUER SUCT BULB TIP 10FT TU (MISCELLANEOUS) ×2 IMPLANT

## 2012-09-13 NOTE — Transfer of Care (Signed)
Immediate Anesthesia Transfer of Care Note  Patient: Curtis Dorsey  Procedure(s) Performed: Procedure(s): OPEN RIGHT INGUINAL HERNIA REPAIR (Right)  Patient Location: PACU  Anesthesia Type:General  Level of Consciousness: sedated  Airway & Oxygen Therapy: Patient Spontanous Breathing and Patient connected to face mask oxygen  Post-op Assessment: Report given to PACU RN and Post -op Vital signs reviewed and stable  Post vital signs: Reviewed and stable  Complications: No apparent anesthesia complications

## 2012-09-13 NOTE — Anesthesia Preprocedure Evaluation (Signed)
Anesthesia Evaluation  Patient identified by MRN, date of birth, ID band Patient awake    Reviewed: Allergy & Precautions, H&P , NPO status , Patient's Chart, lab work & pertinent test results  Airway Mallampati: II TM Distance: >3 FB Neck ROM: Full    Dental no notable dental hx.    Pulmonary neg pulmonary ROS,  breath sounds clear to auscultation  Pulmonary exam normal       Cardiovascular negative cardio ROS  Rhythm:Regular Rate:Normal     Neuro/Psych negative neurological ROS  negative psych ROS   GI/Hepatic Neg liver ROS, hiatal hernia, GERD-  ,  Endo/Other  negative endocrine ROS  Renal/GU negative Renal ROS  negative genitourinary   Musculoskeletal negative musculoskeletal ROS (+)   Abdominal   Peds negative pediatric ROS (+)  Hematology negative hematology ROS (+)   Anesthesia Other Findings   Reproductive/Obstetrics negative OB ROS                           Anesthesia Physical Anesthesia Plan  ASA: II  Anesthesia Plan: General   Post-op Pain Management:    Induction: Intravenous  Airway Management Planned: LMA  Additional Equipment:   Intra-op Plan:   Post-operative Plan: Extubation in OR  Informed Consent: I have reviewed the patients History and Physical, chart, labs and discussed the procedure including the risks, benefits and alternatives for the proposed anesthesia with the patient or authorized representative who has indicated his/her understanding and acceptance.   Dental advisory given  Plan Discussed with: CRNA  Anesthesia Plan Comments:         Anesthesia Quick Evaluation

## 2012-09-13 NOTE — H&P (Signed)
Chief Complaint: Symptomatic right inguinal hernia  History of Present Illness: Curtis Dorsey is an 57 y.o. male on whom I have previously performed a left inguinal hernia in the 1980s and he presented to [facility] in 2004 with a new right inguinal hernia. I discussed laparoscopic repair at that time. He subsequently had a robotic prostatectomy by Dr. Gaynelle Arabian and 2010. He has done well from that. We discussed repair; the the pre-peritoneal space had been dissected for his prostatectomy and I think that for that reason a better repair could be offered him anteriorly. I would therefore propose an open right inguinal hernia with mesh. I explained the procedure to him and gave him a booklet on this. He is aware of the risks and has gone through the same procedure on the left side. I will try to set this up over [facility] day surgery under general anesthesia.  No past medical history on file.  Past Surgical History   Procedure  Laterality  Date   .  Hernia repair   1980 s     Dr. Daphine Deutscher   .  Prosate ca   01/2009     Dr. Monico Blitz onc   .  Tonsillectomy and adenoidectomy   1963    Current Outpatient Prescriptions   Medication  Sig  Dispense  Refill   .  ibuprofen (ADVIL,MOTRIN) 200 MG tablet  Take 200 mg by mouth every 6 (six) hours as needed for pain.     Marland Kitchen  omeprazole (PRILOSEC) 20 MG capsule  Take 20 mg by mouth daily.     .  phenylephrine (SUDAFED PE) 10 MG TABS  Take 10 mg by mouth every 4 (four) hours as needed.      No current facility-administered medications for this visit.   Review of patient's allergies indicates no known allergies.  Family History   Problem  Relation  Age of Onset   .  Cancer  Mother      colohn ca   .  Parkinson's disease  Father    .  Cancer  Paternal Grandmother      colon ca   Social History: has no tobacco, alcohol, and drug history on file.  REVIEW OF SYSTEMS - PERTINENT POSITIVES ONLY:  Otherwise healthy  Physical Exam:  Blood pressure 132/78, pulse 68,  temperature 97.8 F (36.6 C), resp. rate 18, height 6' (1.829 m), weight 195 lb 9.6 oz (88.724 kg).  Body mass index is 26.52 kg/(m^2).  Gen: WDWN WM NAD  Neurological: Alert and oriented to person, place, and time. Motor and sensory function is grossly intact  Head: Normocephalic and atraumatic.  Eyes: Conjunctivae are normal. Pupils are equal, round, and reactive to light. No scleral icterus.  Neck: Normal range of motion. Neck supple. No tracheal deviation or thyromegaly present.  Cardiovascular: SR without murmurs or gallops. No carotid bruits  Respiratory: Effort normal. No respiratory distress. No chest wall tenderness. Breath sounds normal. No wheezes, rales or rhonchi.  Abdomen: Prominent RIH. No left inguinal hernia  GU:  Musculoskeletal: Normal range of motion. Extremities are nontender. No cyanosis, edema or clubbing noted Lymphadenopathy: No cervical, preauricular, postauricular or axillary adenopathy is present Skin: Skin is warm and dry. No rash noted. No diaphoresis. No erythema. No pallor. Pscyh: Normal mood and affect. Behavior is normal. Judgment and thought content normal.  LABORATORY RESULTS:  No results found for this or any previous visit (from the past 48 hour(s)).  RADIOLOGY RESULTS:  No results  found.  Problem List:  There are no active problems to display for this patient.  Assessment & Plan:  Right inguinal hernia. Plan open RIH with mesh at CDS  Matt B. Daphine Deutscher, MD, [facility]  [phone] beeper  [phone]

## 2012-09-13 NOTE — Anesthesia Postprocedure Evaluation (Signed)
  Anesthesia Post-op Note  Patient: Curtis Dorsey  Procedure(s) Performed: Procedure(s) (LRB): OPEN RIGHT INGUINAL HERNIA REPAIR (Right)  Patient Location: PACU  Anesthesia Type: General  Level of Consciousness: awake and alert   Airway and Oxygen Therapy: Patient Spontanous Breathing  Post-op Pain: mild  Post-op Assessment: Post-op Vital signs reviewed, Patient's Cardiovascular Status Stable, Respiratory Function Stable, Patent Airway and No signs of Nausea or vomiting  Last Vitals:  Filed Vitals:   09/13/12 1228  BP: 141/95  Pulse: 59  Temp:   Resp: 16    Post-op Vital Signs: stable   Complications: No apparent anesthesia complications

## 2012-09-13 NOTE — Op Note (Signed)
Surgeon: Wenda Low, MD, FACS  Asst:  none  Anes:  general  Procedure: Open right inguinal hernia  Diagnosis: Large indirect inguinal hernia  Complications: none  EBL:   minimal cc  Description of Procedure:  The patient was taken to oh or 11 in given general anesthesia. The abdomen was prepped with PCMX and draped sterilely. A timeout was performed. A right inguinal oblique incision was made and carried down through a generous panniculus to the external oblique fibers and also to an obvious hernia extruding through the external canal and ring. This was incised and opened the cord was fully mobilized. A large indirect hernia protruded beside the cord structures. This was completely reduced and the internal ring closed with 3 sutures of 2-0 Vicryl. With this having been done a piece of Marlex type mesh was cut to fit the inguinal floor and sutured along the inguinal ligament with a running 2-0 Prolene and medially with the same kind of running 2-0 Prolene. It was wrapped about the cord and affixed to itself with a horizontal mattress suture of 2-0 Prolene.  The external oblique fibers were closed with running 2-0 Vicryl. Exparel was injected into the cord region and the subfascial plane. 4-0 Vicryl was used to the subcutaneous and the skin was closed with a running subcuticular 5-0 Monocryl and Dermabond. Patient are the procedure well was taken recovery room in satisfactory condition.  Matt B. Daphine Deutscher, MD, [facility], [place] [phone]

## 2012-09-14 ENCOUNTER — Encounter (HOSPITAL_COMMUNITY): Payer: Self-pay | Admitting: Surgery

## 2012-09-28 ENCOUNTER — Ambulatory Visit (INDEPENDENT_AMBULATORY_CARE_PROVIDER_SITE_OTHER): Payer: 59 | Admitting: Surgery

## 2012-09-28 ENCOUNTER — Encounter (INDEPENDENT_AMBULATORY_CARE_PROVIDER_SITE_OTHER): Payer: Self-pay | Admitting: Surgery

## 2012-09-28 VITALS — BP 138/72 | HR 64 | Temp 96.8°F | Resp 14 | Ht 72.0 in | Wt 194.8 lb

## 2012-09-28 DIAGNOSIS — Z9889 Other specified postprocedural states: Secondary | ICD-10-CM

## 2012-09-28 DIAGNOSIS — Z8719 Personal history of other diseases of the digestive system: Secondary | ICD-10-CM

## 2012-09-28 NOTE — Patient Instructions (Signed)
Thanks for your patience.  If you need further assistance after leaving the office, please call our office and speak with a [facility] nurse.  [phone].  If you want to leave a message for Dr. Sunjai Levandoski, please call his office phone at [phone]. 

## 2012-09-28 NOTE — Progress Notes (Signed)
Curtis Dorsey 57 y.o.  Body mass index is 26.41 kg/(m^2).  Patient Active Problem List   Diagnosis Date Noted  . S/P right inguinal hernia repair July 2014 09/13/2012  . H/O prostate cancer-post robotic prostatectomy 2010 07/06/2012    No Known Allergies  Past Surgical History  Procedure Laterality Date  . 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 Monico Blitz onc, removed   . Hernia repair  1980 s    Dr. Daphine Deutscher  . Prostatectomy    . Esophagogastroduodenoscopy N/A 09/01/2012    Procedure: ESOPHAGOGASTRODUODENOSCOPY (EGD);  Surgeon: Vertell Novak., MD;  Location: [facility];  Service: Endoscopy;  Laterality: N/A;  need xray  . Savory dilation N/A 09/01/2012    Procedure: SAVORY DILATION;  Surgeon: Vertell Novak., MD;  Location: [facility];  Service: Endoscopy;  Laterality: N/A;  . Tonsillectomy and adenoidectomy  1963    and adenoidectomy   . Inguinal hernia repair Right 09/13/2012    Procedure: OPEN RIGHT INGUINAL HERNIA REPAIR;  Surgeon: Valarie Merino, MD;  Location: [facility];  Service: General;  Laterality: Right;  With Mesh   Johny Blamer, MD No diagnosis found.  Postop followup after a large right indirect inguinal hernia was repaired. He had a moderate degree of ecchymosis and for this. He still has a fair amount of fullness and a scrotal region. This is not unexpected based on what I found what it. I think I will go ahead and see about seeing him back in a couple months injuries okay until about what about 6 weeks before returning to really strenuous activities. Matt B. Daphine Deutscher, MD, [facility] [phone] beeper [phone]  09/28/2012 9:33 AM

## 2012-10-27 ENCOUNTER — Encounter (INDEPENDENT_AMBULATORY_CARE_PROVIDER_SITE_OTHER): Payer: 59 | Admitting: Surgery

## 2012-11-29 ENCOUNTER — Ambulatory Visit (INDEPENDENT_AMBULATORY_CARE_PROVIDER_SITE_OTHER): Payer: 59 | Admitting: Surgery

## 2012-11-29 ENCOUNTER — Encounter (INDEPENDENT_AMBULATORY_CARE_PROVIDER_SITE_OTHER): Payer: Self-pay

## 2012-11-29 ENCOUNTER — Encounter (INDEPENDENT_AMBULATORY_CARE_PROVIDER_SITE_OTHER): Payer: Self-pay | Admitting: Surgery

## 2012-11-29 VITALS — BP 128/80 | HR 86 | Temp 98.7°F | Resp 16 | Ht 72.0 in | Wt 197.4 lb

## 2012-11-29 DIAGNOSIS — K649 Unspecified hemorrhoids: Secondary | ICD-10-CM | POA: Insufficient documentation

## 2012-11-29 NOTE — Patient Instructions (Addendum)
Hemorrhoids Hemorrhoids are swollen veins around the rectum or anus. There are two types of hemorrhoids:   Internal hemorrhoids. These occur in the veins just inside the rectum. They may poke through to the outside and become irritated and painful.  External hemorrhoids. These occur in the veins outside the anus and can be felt as a painful swelling or hard lump near the anus. CAUSES  Pregnancy.   Obesity.   Constipation or diarrhea.   Straining to have a bowel movement.   Sitting for long periods on the toilet.  Heavy lifting or other activity that caused you to strain.  Anal intercourse. SYMPTOMS   Pain.   Anal itching or irritation.   Rectal bleeding.   Fecal leakage.   Anal swelling.   One or more lumps around the anus.  DIAGNOSIS  Your caregiver may be able to diagnose hemorrhoids by visual examination. Other examinations or tests that may be performed include:   Examination of the rectal area with a gloved hand (digital rectal exam).   Examination of anal canal using a small tube (scope).   A blood test if you have lost a significant amount of blood.  A test to look inside the colon (sigmoidoscopy or colonoscopy). TREATMENT Most hemorrhoids can be treated at home. However, if symptoms do not seem to be getting better or if you have a lot of rectal bleeding, your caregiver may perform a procedure to help make the hemorrhoids get smaller or remove them completely. Possible treatments include:   Placing a rubber band at the base of the hemorrhoid to cut off the circulation (rubber band ligation).   Injecting a chemical to shrink the hemorrhoid (sclerotherapy).   Using a tool to burn the hemorrhoid (infrared light therapy).   Surgically removing the hemorrhoid (hemorrhoidectomy).   Stapling the hemorrhoid to block blood flow to the tissue (hemorrhoid stapling).  HOME CARE INSTRUCTIONS   Eat foods with fiber, such as whole grains, beans,  nuts, fruits, and vegetables. Ask your doctor about taking products with added fiber in them (fibersupplements).  Increase fluid intake. Drink enough water and fluids to keep your urine clear or pale yellow.   Exercise regularly.   Go to the bathroom when you have the urge to have a bowel movement. Do not wait.   Avoid straining to have bowel movements.   Keep the anal area dry and clean. Use wet toilet paper or moist towelettes after a bowel movement.   Medicated creams and suppositories may be used or applied as directed.   Only take over-the-counter or prescription medicines as directed by your caregiver.   Take warm sitz baths for 15 20 minutes, 3 4 times a day to ease pain and discomfort.   Place ice packs on the hemorrhoids if they are tender and swollen. Using ice packs between sitz baths may be helpful.   Put ice in a plastic bag.   Place a towel between your skin and the bag.   Leave the ice on for 15 20 minutes, 3 4 times a day.   Do not use a donut-shaped pillow or sit on the toilet for long periods. This increases blood pooling and pain.  SEEK MEDICAL CARE IF:  You have increasing pain and swelling that is not controlled by treatment or medicine.  You have uncontrolled bleeding.  You have difficulty or you are unable to have a bowel movement.  You have pain or inflammation outside the area of the hemorrhoids. MAKE SURE YOU:    Understand these instructions.  Will watch your condition.  Will get help right away if you are not doing well or get worse. Document Released: 01/30/2000 Document Revised: 01/19/2012 Document Reviewed: 12/07/2011 [facility] Patient Information 2014 [place], [place].   Return if the hemorrhoid does not regress and continues to prolapse despite your efforts to keep it reduced.

## 2012-11-29 NOTE — Progress Notes (Signed)
Curtis Dorsey 57 y.o.  Body mass index is 26.77 kg/(m^2).  Patient Active Problem List   Diagnosis Date Noted  . Hemorrhoid 11/29/2012  . S/P right inguinal hernia repair July 2014 09/13/2012  . H/O prostate cancer-post robotic prostatectomy 2010 07/06/2012    No Known Allergies  Past Surgical History  Procedure Laterality Date  . 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 Monico Blitz onc, removed   . Hernia repair  1980 s    Dr. Daphine Deutscher  . Prostatectomy    . Esophagogastroduodenoscopy N/A 09/01/2012    Procedure: ESOPHAGOGASTRODUODENOSCOPY (EGD);  Surgeon: Vertell Novak., MD;  Location: [facility];  Service: Endoscopy;  Laterality: N/A;  need xray  . Savory dilation N/A 09/01/2012    Procedure: SAVORY DILATION;  Surgeon: Vertell Novak., MD;  Location: [facility];  Service: Endoscopy;  Laterality: N/A;  . Tonsillectomy and adenoidectomy  1963    and adenoidectomy   . Inguinal hernia repair Right 09/13/2012    Procedure: OPEN RIGHT INGUINAL HERNIA REPAIR;  Surgeon: Valarie Merino, MD;  Location: [facility];  Service: General;  Laterality: Right;  With Mesh   Johny Blamer, MD 1. Hemorrhoid     Right inguinal hernia repair is intact.  No recurrence.   He does has a prolapsed, thrombosed internal  Hemorrhiod.  It does not appear to be painful.  I reduced this and told him how to reduce them.  If they continue to be a problem it may require hemorrhoidectomy Matt B. Daphine Deutscher, MD, [facility] [phone] beeper [phone]  11/29/2012 2:34 PM

## 2013-08-03 NOTE — Addendum Note (Signed)
Addended by: Vernie Ammons. on: 08/03/2013 07:37 AM   Modules accepted: Orders

## 2015-09-12 ENCOUNTER — Encounter (HOSPITAL_COMMUNITY): Payer: Self-pay | Admitting: Emergency Medicine

## 2015-09-12 DIAGNOSIS — Z7982 Long term (current) use of aspirin: Secondary | ICD-10-CM | POA: Insufficient documentation

## 2015-09-12 DIAGNOSIS — R1011 Right upper quadrant pain: Secondary | ICD-10-CM | POA: Diagnosis present

## 2015-09-12 DIAGNOSIS — K8020 Calculus of gallbladder without cholecystitis without obstruction: Secondary | ICD-10-CM | POA: Insufficient documentation

## 2015-09-12 DIAGNOSIS — Z8546 Personal history of malignant neoplasm of prostate: Secondary | ICD-10-CM | POA: Insufficient documentation

## 2015-09-12 LAB — CBC
HEMATOCRIT: 44.6 % (ref 39.0–52.0)
Hemoglobin: 14 g/dL (ref 13.0–17.0)
MCH: 25.4 pg — ABNORMAL LOW (ref 26.0–34.0)
MCHC: 31.4 g/dL (ref 30.0–36.0)
MCV: 80.9 fL (ref 78.0–100.0)
PLATELETS: 315 10*3/uL (ref 150–400)
RBC: 5.51 MIL/uL (ref 4.22–5.81)
RDW: 15.8 % — AB (ref 11.5–15.5)
WBC: 6.3 10*3/uL (ref 4.0–10.5)

## 2015-09-12 LAB — COMPREHENSIVE METABOLIC PANEL
ALBUMIN: 3.8 g/dL (ref 3.5–5.0)
ALT: 20 U/L (ref 17–63)
AST: 20 U/L (ref 15–41)
Alkaline Phosphatase: 65 U/L (ref 38–126)
Anion gap: 6 (ref 5–15)
BUN: 7 mg/dL (ref 6–20)
CALCIUM: 9 mg/dL (ref 8.9–10.3)
CHLORIDE: 102 mmol/L (ref 101–111)
CO2: 27 mmol/L (ref 22–32)
Creatinine, Ser: 0.95 mg/dL (ref 0.61–1.24)
GFR calc Af Amer: >60 mL/min (ref >60)
Glucose, Bld: 108 mg/dL — ABNORMAL HIGH (ref 65–99)
POTASSIUM: 3.7 mmol/L (ref 3.5–5.1)
SODIUM: 135 mmol/L (ref 135–145)
TOTAL PROTEIN: 6.2 g/dL — AB (ref 6.5–8.1)
Total Bilirubin: 0.4 mg/dL (ref 0.3–1.2)

## 2015-09-12 LAB — URINALYSIS, ROUTINE W REFLEX MICROSCOPIC
Bilirubin Urine: NEGATIVE
Glucose, UA: NEGATIVE mg/dL
Hgb urine dipstick: NEGATIVE
KETONES UR: NEGATIVE mg/dL
LEUKOCYTES UA: NEGATIVE
NITRITE: NEGATIVE
PH: 6 (ref 5.0–8.0)
PROTEIN: NEGATIVE mg/dL
Specific Gravity, Urine: 1.019 (ref 1.005–1.030)

## 2015-09-12 LAB — LIPASE, BLOOD: LIPASE: 37 U/L (ref 11–51)

## 2015-09-12 NOTE — ED Triage Notes (Signed)
Pt. reports mid /RUQ abdominal pain with emesis (x1) this evening , denies fever or diarrhea .

## 2015-09-13 ENCOUNTER — Emergency Department (HOSPITAL_COMMUNITY)
Admission: EM | Admit: 2015-09-13 | Discharge: 2015-09-13 | Disposition: A | Discharge status: Home / Self Care | Payer: 59 | Attending: Emergency Medicine | Admitting: Emergency Medicine

## 2015-09-13 ENCOUNTER — Emergency Department (HOSPITAL_COMMUNITY): Payer: 59

## 2015-09-13 DIAGNOSIS — K8020 Calculus of gallbladder without cholecystitis without obstruction: Secondary | ICD-10-CM

## 2015-09-13 MED ORDER — HYDROCODONE-ACETAMINOPHEN 5-325 MG PO TABS: 1.0000 | ORAL_TABLET | ORAL | 0 refills | Status: DC | PRN

## 2015-09-13 MED ORDER — ONDANSETRON 4 MG PO TBDP
8.0000 mg | ORAL_TABLET | Freq: Once | ORAL | Status: AC
  Administered 2015-09-13: 8 mg via ORAL
  Filled 2015-09-13: qty 2

## 2015-09-13 MED ORDER — KETOROLAC TROMETHAMINE 60 MG/2ML IM SOLN
60.0000 mg | Freq: Once | INTRAMUSCULAR | Status: AC
  Administered 2015-09-13: 60 mg via INTRAMUSCULAR
  Filled 2015-09-13: qty 2

## 2015-09-13 MED ORDER — HYDROMORPHONE HCL 1 MG/ML IJ SOLN
1.0000 mg | Freq: Once | INTRAMUSCULAR | Status: AC
  Administered 2015-09-13: 1 mg via INTRAMUSCULAR
  Filled 2015-09-13: qty 1

## 2015-09-13 MED ORDER — RANITIDINE HCL 150 MG PO TABS
150.0000 mg | ORAL_TABLET | Freq: Two times a day (BID) | ORAL | 0 refills | Status: DC
Start: 2015-09-13 — End: 2019-03-05

## 2015-09-13 NOTE — ED Provider Notes (Signed)
[facility] DEPT Provider Note   CSN: [id number] Arrival date & time: 09/12/15  2231  First Provider Contact:  First MD Initiated Contact with Patient 09/13/15 0113     By signing my name below, I, Soijett Blue, attest that this documentation has been prepared under the direction and in the presence of Orpah Greek, MD. Electronically Signed: Soijett Blue, ED Scribe. 09/13/15. 1:20 AM.  History   Chief Complaint Chief Complaint  Patient presents with  . Abdominal Pain    HPI Curtis Dorsey is a 60 y.o. male with a medical hx of GERD and prostate CA who presents to the Emergency Department complaining of mildly resolved abdominal pain onset 2-3 hours. Pt notes that he initially had mid abdominal pain that has radiated to his RUQ. Denies having symptoms like this in the past. Pt notes that he still has his gallbladder. He states that he is having associated symptoms of vomiting x 1 episode. He states that he has not tried any medications for the relief for his symptoms. He denies fever, chills, diarrhea, and any other symptoms.  The history is provided by the patient. No language interpreter was used.    Past Medical History:  Diagnosis Date  . Cancer [facility])    prostate 2010  . Esophageal stricture   . GERD (gastroesophageal reflux disease)   . H/O hiatal hernia   . Hx of inguinal hernia surgery    right; will have surgery 09/13/2012    Patient Active Problem List   Diagnosis Date Noted  . Hemorrhoid 11/29/2012  . S/P right inguinal hernia repair July 2014 09/13/2012  . H/O prostate cancer-post robotic prostatectomy 2010 07/06/2012    Past Surgical History:  Procedure Laterality Date  . ESOPHAGOGASTRODUODENOSCOPY N/A 09/01/2012   Procedure: ESOPHAGOGASTRODUODENOSCOPY (EGD);  Surgeon: Winfield Cunas., MD;  Location: [facility];  Service: Endoscopy;  Laterality: N/A;  need xray  . HERNIA REPAIR  1980 s   Dr. Hassell Done  . INGUINAL HERNIA REPAIR Right 09/13/2012     Procedure: OPEN RIGHT INGUINAL HERNIA REPAIR;  Surgeon: Pedro Earls, MD;  Location: [facility];  Service: General;  Laterality: Right;  With Mesh  . 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 PROSTATECTOMY    . SAVORY DILATION N/A 09/01/2012   Procedure: SAVORY DILATION;  Surgeon: Winfield Cunas., MD;  Location: [facility];  Service: Endoscopy;  Laterality: N/A;  . [place]   and adenoidectomy        Home Medications    Prior to Admission medications   Medication Sig Start Date End Date Taking? Authorizing Provider  aspirin EC 81 MG tablet Take 81 mg by mouth daily.    Historical Provider, MD  cholecalciferol (VITAMIN D) 1000 UNITS tablet Take 1,000 Units by mouth daily. Pt takes 5000-10000units daily    Historical Provider, MD  ibuprofen (ADVIL,MOTRIN) 200 MG tablet Take 200 mg by mouth every 6 (six) hours as needed for pain.    Historical Provider, MD  MAGNESIUM-ZINC PO Take 1 tablet by mouth daily.    Historical Provider, MD  omeprazole (PRILOSEC) 20 MG capsule Take 20 mg by mouth daily.    Historical Provider, MD  phenylephrine (SUDAFED PE) 10 MG TABS Take 10 mg by mouth every 4 (four) hours as needed (congestion).     Historical Provider, MD    Family History Family History  Problem Relation Age of Onset  . Cancer  Mother     colohn ca  . Parkinson's disease Father   . Cancer Paternal Grandmother     colon ca    Social History Social History  Substance Use Topics  . Smoking status: Never Smoker  . Smokeless tobacco: Never Used  . Alcohol use Yes     Comment: 3-4 drinks a day berrs per day      Allergies   Review of patient's allergies indicates no known allergies.   Review of Systems Review of Systems  Constitutional: Negative for chills and fever.  Gastrointestinal: Positive for abdominal pain and vomiting. Negative for diarrhea.  All other systems reviewed and are negative.    Physical Exam Updated Vital  Signs BP (!) 154/107   Pulse 61   Temp 97.4 F (36.3 C) (Oral)   Resp 18   Ht 6' (1.829 m)   Wt 201 lb 2 oz (91.2 kg)   SpO2 100%   BMI 27.28 kg/m   Physical Exam  Constitutional: He is oriented to person, place, and time. He appears well-developed and well-nourished. No distress.  HENT:  Head: Normocephalic and atraumatic.  Right Ear: Hearing normal.  Left Ear: Hearing normal.  Nose: Nose normal.  Mouth/Throat: Oropharynx is clear and moist and mucous membranes are normal.  Eyes: Conjunctivae and EOM are normal. Pupils are equal, round, and reactive to light.  Neck: Normal range of motion. Neck supple.  Cardiovascular: Regular rhythm, S1 normal and S2 normal.  Exam reveals no gallop and no friction rub.   No murmur heard. Pulmonary/Chest: Effort normal and breath sounds normal. No respiratory distress. He exhibits no tenderness.  Abdominal: Soft. Normal appearance and bowel sounds are normal. There is no hepatosplenomegaly. There is tenderness in the right upper quadrant. There is no rebound, no guarding, no tenderness at McBurney's point and negative Murphy's sign. No hernia.  Tenderness to RUQ. Negative Murphy's sign.   Musculoskeletal: Normal range of motion.  Neurological: He is alert and oriented to person, place, and time. He has normal strength. No cranial nerve deficit or sensory deficit. Coordination normal. GCS eye subscore is 4. GCS verbal subscore is 5. GCS motor subscore is 6.  Skin: Skin is warm, dry and intact. No rash noted. No cyanosis.  Psychiatric: He has a normal mood and affect. His speech is normal and behavior is normal. Thought content normal.  Nursing note and vitals reviewed.    ED Treatments / Results  DIAGNOSTIC STUDIES: Oxygen Saturation is 98% on RA, nl by my interpretation.    COORDINATION OF CARE: 1:18 AM Discussed treatment plan with pt at bedside which includes labs, UA, US abdomen limited, and pt agreed to plan.   Labs (all labs ordered  are listed, but only abnormal results are displayed) Labs Reviewed  COMPREHENSIVE METABOLIC PANEL - Abnormal; Notable for the following:       Result Value   Glucose, Bld 108 (*)    Total Protein 6.2 (*)    All other components within normal limits  CBC - Abnormal; Notable for the following:    MCH 25.4 (*)    RDW 15.8 (*)    All other components within normal limits  LIPASE, BLOOD  URINALYSIS, ROUTINE W REFLEX MICROSCOPIC (NOT AT [facility])    EKG  EKG Interpretation None       Radiology US Abdomen Limited Ruq  Result Date: 09/13/2015 CLINICAL DATA:  Right upper quadrant pain onset 2-3 hours ago with some vomiting. EXAM: US ABDOMEN LIMITED - RIGHT  UPPER QUADRANT COMPARISON:  11/22/2008, CT of the abdomen FINDINGS: Gallbladder: There is a 1.8 cm stone in the gallbladder neck, non mobile. Wall is borderline thickened measuring 4 mm. No pericholecystic fluid. No sonographic Murphy's sign. Common bile duct: Diameter: 4 mm Liver: No focal lesion identified. Within normal limits in parenchymal echogenicity. IMPRESSION: 1. 1.8 cm stone lodged in the gallbladder neck with borderline wall thickening but no other findings suggesting acute cholecystitis. 2. No other abnormalities. Electronically Signed   By: Lajean Manes M.D.   On: 09/13/2015 01:50   Procedures Procedures (including critical care time)  Medications Ordered in ED Medications - No data to display   Initial Impression / Assessment and Plan / ED Course  I have reviewed the triage vital signs and the nursing notes.  Pertinent labs & imaging results that were available during my care of the patient were reviewed by me and considered in my medical decision making (see chart for details).  Clinical Course    Patient presents to the emergency part with complaints of epigastric and right upper quadrant pain that began earlier tonight and last for several hours. Patient reports fairly severe pain at onset and it has improved but is  still slightly present. Examination revealed right upper quadrant tenderness but no Murphy sign. Lab work was unremarkable including LFTs. Patient did undergo ultrasound which does confirm gallstones without evidence of cholecystitis. Patient will be discharged, follow-up with general surgery in the office. Return if he has recurrence of severe pain.  Final Clinical Impressions(s) / ED Diagnoses   Final diagnoses:  None  Cholelithiasis  New Prescriptions New Prescriptions   No medications on file    I personally performed the services described in this documentation, which was scribed in my presence. The recorded information has been reviewed and is accurate.     Orpah Greek, MD 09/13/15 [phone]

## 2015-10-14 ENCOUNTER — Ambulatory Visit: Payer: Self-pay | Admitting: Surgery

## 2015-10-14 NOTE — Patient Instructions (Addendum)
Curtis Dorsey  10/14/2015   Your procedure is scheduled on: 10/17/15  Report to [facility] Main  Entrance take [place]  elevators to 3rd floor to  [place] at 0930 AM.  Call this number if you have problems the morning of surgery [phone]   Remember: ONLY 1 PERSON MAY GO WITH YOU TO SHORT STAY TO GET  READY MORNING OF [place].  Do not eat food or drink liquids :After Midnight Thursday     Take these medicines the morning of surgery with A SIP OF WATER:Zantac                                You may not have any metal on your body including hair pins and              piercings  Do not wear jewelry.              Do not bring valuables to the hospital. [place].  Contacts, dentures or bridgework may not be worn into surgery.       Patients discharged the day of surgery will not be allowed to drive home.  Name and phone number of your driver:Darlene  Special Instructions: N/A          _____________________________________________________________________             [facility] - Preparing for Surgery Before surgery, you can play an important role.  Because skin is not sterile, your skin needs to be as free of germs as possible.  You can reduce the number of germs on your skin by washing with CHG (chlorahexidine gluconate) soap before surgery.  CHG is an antiseptic cleaner which kills germs and bonds with the skin to continue killing germs even after washing. Please DO NOT use if you have an allergy to CHG or antibacterial soaps.  If your skin becomes reddened/irritated stop using the CHG and inform your nurse when you arrive at Short Stay. Do not shave (including legs and underarms) for at least 48 hours prior to the first CHG shower.  You may shave your face/neck. Please follow these instructions carefully:  1.  Shower with CHG Soap the night before surgery and the  morning of Surgery.  2.  If  you choose to wash your hair, wash your hair first as usual with your  normal  shampoo.  3.  After you shampoo, rinse your hair and body thoroughly to remove the  shampoo.                           4.  Use CHG as you would any other liquid soap.  You can apply chg directly  to the skin and wash                       Gently with a scrungie or clean washcloth.  5.  Apply the CHG Soap to your body ONLY FROM THE NECK DOWN.   Do not use on face/ open                           Wound  or open sores. Avoid contact with eyes, ears mouth and genitals (private parts).                       Wash face,  Genitals (private parts) with your normal soap.             6.  Wash thoroughly, paying special attention to the area where your surgery  will be performed.  7.  Thoroughly rinse your body with warm water from the neck down.  8.  DO NOT shower/wash with your normal soap after using and rinsing off  the CHG Soap.                9.  Pat yourself dry with a clean towel.            10.  Wear clean pajamas.            11.  Place clean sheets on your bed the night of your first shower and do not  sleep with pets. Day of Surgery : Do not apply any lotions/deodorants the morning of surgery.  Please wear clean clothes to the hospital/surgery center.  FAILURE TO FOLLOW THESE INSTRUCTIONS MAY RESULT IN THE CANCELLATION OF YOUR SURGERY PATIENT SIGNATURE_________________________________  NURSE SIGNATURE__________________________________  ________________________________________________________________________

## 2015-10-14 NOTE — Progress Notes (Signed)
Please place surgical orders in epic. Pt has preop appt scheduled for 10/15/2015. Thanks.

## 2015-10-15 ENCOUNTER — Encounter (HOSPITAL_COMMUNITY): Payer: Self-pay

## 2015-10-15 ENCOUNTER — Encounter (INDEPENDENT_AMBULATORY_CARE_PROVIDER_SITE_OTHER): Payer: Self-pay

## 2015-10-15 ENCOUNTER — Encounter (HOSPITAL_COMMUNITY)
Admission: RE | Admit: 2015-10-15 | Discharge: 2015-10-15 | Disposition: A | Discharge status: Home / Self Care | Payer: 59 | Source: Ambulatory Visit | Attending: Surgery | Admitting: Surgery

## 2015-10-15 DIAGNOSIS — K8020 Calculus of gallbladder without cholecystitis without obstruction: Secondary | ICD-10-CM | POA: Diagnosis present

## 2015-10-15 DIAGNOSIS — Z79899 Other long term (current) drug therapy: Secondary | ICD-10-CM | POA: Diagnosis not present

## 2015-10-15 DIAGNOSIS — K219 Gastro-esophageal reflux disease without esophagitis: Secondary | ICD-10-CM | POA: Diagnosis not present

## 2015-10-15 DIAGNOSIS — Z791 Long term (current) use of non-steroidal anti-inflammatories (NSAID): Secondary | ICD-10-CM | POA: Diagnosis not present

## 2015-10-15 DIAGNOSIS — K8010 Calculus of gallbladder with chronic cholecystitis without obstruction: Secondary | ICD-10-CM | POA: Diagnosis not present

## 2015-10-15 LAB — CBC
HCT: 46.9 % (ref 39.0–52.0)
Hemoglobin: 14.8 g/dL (ref 13.0–17.0)
MCH: 25.8 pg — AB (ref 26.0–34.0)
MCHC: 31.6 g/dL (ref 30.0–36.0)
MCV: 81.8 fL (ref 78.0–100.0)
PLATELETS: 290 10*3/uL (ref 150–400)
RBC: 5.73 MIL/uL (ref 4.22–5.81)
RDW: 16.4 % — AB (ref 11.5–15.5)
WBC: 5.7 10*3/uL (ref 4.0–10.5)

## 2015-10-17 ENCOUNTER — Ambulatory Visit (HOSPITAL_COMMUNITY): Payer: 59 | Admitting: Anesthesiology

## 2015-10-17 ENCOUNTER — Encounter (HOSPITAL_COMMUNITY)
Admission: RE | Disposition: A | Discharge status: Home / Self Care | Payer: Self-pay | Source: Ambulatory Visit | Attending: Surgery

## 2015-10-17 ENCOUNTER — Encounter (HOSPITAL_COMMUNITY): Payer: Self-pay | Admitting: *Deleted

## 2015-10-17 ENCOUNTER — Ambulatory Visit (HOSPITAL_COMMUNITY): Payer: 59

## 2015-10-17 ENCOUNTER — Ambulatory Visit (HOSPITAL_COMMUNITY)
Admission: RE | Admit: 2015-10-17 | Discharge: 2015-10-17 | Disposition: A | Discharge status: Home / Self Care | Payer: 59 | Source: Ambulatory Visit | Attending: Surgery | Admitting: Surgery

## 2015-10-17 DIAGNOSIS — Z9049 Acquired absence of other specified parts of digestive tract: Secondary | ICD-10-CM

## 2015-10-17 DIAGNOSIS — Z419 Encounter for procedure for purposes other than remedying health state, unspecified: Secondary | ICD-10-CM

## 2015-10-17 DIAGNOSIS — Z79899 Other long term (current) drug therapy: Secondary | ICD-10-CM | POA: Insufficient documentation

## 2015-10-17 DIAGNOSIS — Z791 Long term (current) use of non-steroidal anti-inflammatories (NSAID): Secondary | ICD-10-CM | POA: Insufficient documentation

## 2015-10-17 DIAGNOSIS — K8010 Calculus of gallbladder with chronic cholecystitis without obstruction: Secondary | ICD-10-CM | POA: Insufficient documentation

## 2015-10-17 DIAGNOSIS — K219 Gastro-esophageal reflux disease without esophagitis: Secondary | ICD-10-CM | POA: Insufficient documentation

## 2015-10-17 HISTORY — PX: CHOLECYSTECTOMY: SHX55

## 2015-10-17 SURGERY — LAPAROSCOPIC CHOLECYSTECTOMY WITH INTRAOPERATIVE CHOLANGIOGRAM
Anesthesia: General

## 2015-10-17 MED ORDER — ACETAMINOPHEN 325 MG PO TABS: 650.0000 mg | ORAL_TABLET | ORAL | Status: DC | PRN

## 2015-10-17 MED ORDER — CEFAZOLIN SODIUM-DEXTROSE 2-4 GM/100ML-% IV SOLN
2.0000 g | INTRAVENOUS | Status: AC
  Administered 2015-10-17: 2 g via INTRAVENOUS
  Filled 2015-10-17: qty 100

## 2015-10-17 MED ORDER — LACTATED RINGERS IV SOLN
INTRAVENOUS | Status: DC
Start: 2015-10-17 — End: 2015-10-17
  Administered 2015-10-17: 11:00:00 via INTRAVENOUS

## 2015-10-17 MED ORDER — SODIUM CHLORIDE 0.9 % IJ SOLN
INTRAMUSCULAR | Status: AC
  Filled 2015-10-17: qty 50

## 2015-10-17 MED ORDER — PROMETHAZINE HCL 25 MG/ML IJ SOLN: 6.2500 mg | INTRAMUSCULAR | Status: DC | PRN

## 2015-10-17 MED ORDER — SODIUM CHLORIDE 0.9 % IV SOLN
250.0000 mL | INTRAVENOUS | Status: DC | PRN
Start: 2015-10-17 — End: 2015-10-17

## 2015-10-17 MED ORDER — MIDAZOLAM HCL 5 MG/5ML IJ SOLN
INTRAMUSCULAR | Status: DC | PRN
  Administered 2015-10-17: 2 mg via INTRAVENOUS

## 2015-10-17 MED ORDER — GABAPENTIN 300 MG PO CAPS
300.0000 mg | ORAL_CAPSULE | ORAL | Status: AC
  Administered 2015-10-17: 300 mg via ORAL
  Filled 2015-10-17: qty 1

## 2015-10-17 MED ORDER — LIDOCAINE 2% (20 MG/ML) 5 ML SYRINGE
INTRAMUSCULAR | Status: AC
Start: 2015-10-17 — End: 2015-10-17
  Filled 2015-10-17: qty 5

## 2015-10-17 MED ORDER — PROPOFOL 10 MG/ML IV BOLUS
INTRAVENOUS | Status: DC | PRN
  Administered 2015-10-17: 150 mg via INTRAVENOUS

## 2015-10-17 MED ORDER — ROCURONIUM BROMIDE 100 MG/10ML IV SOLN
INTRAVENOUS | Status: DC | PRN
  Administered 2015-10-17: 50 mg via INTRAVENOUS

## 2015-10-17 MED ORDER — BUPIVACAINE LIPOSOME 1.3 % IJ SUSP
20.0000 mL | Freq: Once | INTRAMUSCULAR | Status: AC
  Administered 2015-10-17: 20 mL
  Filled 2015-10-17: qty 20

## 2015-10-17 MED ORDER — CEFAZOLIN SODIUM-DEXTROSE 2-4 GM/100ML-% IV SOLN
INTRAVENOUS | Status: AC
  Filled 2015-10-17: qty 100

## 2015-10-17 MED ORDER — MIDAZOLAM HCL 2 MG/2ML IJ SOLN
INTRAMUSCULAR | Status: AC
  Filled 2015-10-17: qty 2

## 2015-10-17 MED ORDER — ACETAMINOPHEN 10 MG/ML IV SOLN
1000.0000 mg | Freq: Once | INTRAVENOUS | Status: AC
  Administered 2015-10-17: 1000 mg via INTRAVENOUS
  Filled 2015-10-17: qty 100

## 2015-10-17 MED ORDER — SODIUM CHLORIDE 0.9% FLUSH: 3.0000 mL | INTRAVENOUS | Status: DC | PRN

## 2015-10-17 MED ORDER — HEPARIN SODIUM (PORCINE) 5000 UNIT/ML IJ SOLN
5000.0000 [IU] | Freq: Once | INTRAMUSCULAR | Status: AC
  Administered 2015-10-17: 5000 [IU] via SUBCUTANEOUS
  Filled 2015-10-17: qty 1

## 2015-10-17 MED ORDER — SODIUM CHLORIDE 0.9% FLUSH
3.0000 mL | Freq: Two times a day (BID) | INTRAVENOUS | Status: DC
Start: 2015-10-17 — End: 2015-10-17

## 2015-10-17 MED ORDER — SUGAMMADEX SODIUM 200 MG/2ML IV SOLN
INTRAVENOUS | Status: AC
  Filled 2015-10-17: qty 2

## 2015-10-17 MED ORDER — HYDROCODONE-ACETAMINOPHEN 5-325 MG PO TABS: 1.0000 | ORAL_TABLET | ORAL | 0 refills | Status: DC | PRN

## 2015-10-17 MED ORDER — HYDROMORPHONE HCL 1 MG/ML IJ SOLN: 0.2500 mg | INTRAMUSCULAR | Status: DC | PRN

## 2015-10-17 MED ORDER — SUGAMMADEX SODIUM 200 MG/2ML IV SOLN
INTRAVENOUS | Status: DC | PRN
  Administered 2015-10-17: 200 mg via INTRAVENOUS

## 2015-10-17 MED ORDER — FENTANYL CITRATE (PF) 250 MCG/5ML IJ SOLN
INTRAMUSCULAR | Status: AC
  Filled 2015-10-17: qty 5

## 2015-10-17 MED ORDER — ACETAMINOPHEN 10 MG/ML IV SOLN
INTRAVENOUS | Status: AC
  Filled 2015-10-17: qty 100

## 2015-10-17 MED ORDER — ROCURONIUM BROMIDE 100 MG/10ML IV SOLN
INTRAVENOUS | Status: AC
  Filled 2015-10-17: qty 1

## 2015-10-17 MED ORDER — ACETAMINOPHEN 500 MG PO TABS
1000.0000 mg | ORAL_TABLET | ORAL | Status: DC
  Filled 2015-10-17: qty 2

## 2015-10-17 MED ORDER — LIDOCAINE HCL (CARDIAC) 20 MG/ML IV SOLN
INTRAVENOUS | Status: DC | PRN
  Administered 2015-10-17: 100 mg via INTRATRACHEAL

## 2015-10-17 MED ORDER — 0.9 % SODIUM CHLORIDE (POUR BTL) OPTIME
TOPICAL | Status: DC | PRN
  Administered 2015-10-17: 1000 mL

## 2015-10-17 MED ORDER — MEPERIDINE HCL 50 MG/ML IJ SOLN: 6.2500 mg | INTRAMUSCULAR | Status: DC | PRN

## 2015-10-17 MED ORDER — BUPIVACAINE-EPINEPHRINE 0.25% -1:200000 IJ SOLN
INTRAMUSCULAR | Status: AC
  Filled 2015-10-17: qty 1

## 2015-10-17 MED ORDER — CELECOXIB 200 MG PO CAPS
400.0000 mg | ORAL_CAPSULE | ORAL | Status: AC
  Administered 2015-10-17: 400 mg via ORAL
  Filled 2015-10-17: qty 2

## 2015-10-17 MED ORDER — DEXAMETHASONE SODIUM PHOSPHATE 10 MG/ML IJ SOLN
INTRAMUSCULAR | Status: AC
Start: 2015-10-17 — End: 2015-10-17
  Filled 2015-10-17: qty 1

## 2015-10-17 MED ORDER — EPHEDRINE SULFATE 50 MG/ML IJ SOLN
INTRAMUSCULAR | Status: DC | PRN
  Administered 2015-10-17: 15 mg via INTRAVENOUS

## 2015-10-17 MED ORDER — DEXAMETHASONE SODIUM PHOSPHATE 10 MG/ML IJ SOLN
INTRAMUSCULAR | Status: DC | PRN
  Administered 2015-10-17: 10 mg via INTRAVENOUS

## 2015-10-17 MED ORDER — LACTATED RINGERS IR SOLN
Status: DC | PRN
  Administered 2015-10-17: 1

## 2015-10-17 MED ORDER — ONDANSETRON HCL 4 MG/2ML IJ SOLN
INTRAMUSCULAR | Status: DC | PRN
  Administered 2015-10-17: 4 mg via INTRAVENOUS

## 2015-10-17 MED ORDER — PHENYLEPHRINE HCL 10 MG/ML IJ SOLN
INTRAMUSCULAR | Status: DC | PRN
  Administered 2015-10-17: 80 ug via INTRAVENOUS

## 2015-10-17 MED ORDER — ONDANSETRON HCL 4 MG/2ML IJ SOLN
INTRAMUSCULAR | Status: AC
  Filled 2015-10-17: qty 2

## 2015-10-17 MED ORDER — PHENYLEPHRINE 40 MCG/ML (10ML) SYRINGE FOR IV PUSH (FOR BLOOD PRESSURE SUPPORT)
PREFILLED_SYRINGE | INTRAVENOUS | Status: AC
  Filled 2015-10-17: qty 10

## 2015-10-17 MED ORDER — EPHEDRINE 5 MG/ML INJ
INTRAVENOUS | Status: AC
  Filled 2015-10-17: qty 10

## 2015-10-17 MED ORDER — FENTANYL CITRATE (PF) 250 MCG/5ML IJ SOLN
INTRAMUSCULAR | Status: DC | PRN
  Administered 2015-10-17 (×2): 50 ug via INTRAVENOUS

## 2015-10-17 MED ORDER — PROPOFOL 10 MG/ML IV BOLUS
INTRAVENOUS | Status: AC
  Filled 2015-10-17: qty 20

## 2015-10-17 MED ORDER — ACETAMINOPHEN 650 MG RE SUPP
650.0000 mg | RECTAL | Status: DC | PRN
  Filled 2015-10-17: qty 1

## 2015-10-17 MED ORDER — OXYCODONE HCL 5 MG PO TABS: 5.0000 mg | ORAL_TABLET | ORAL | Status: DC | PRN

## 2015-10-17 MED FILL — HYDROCODON-APAP 5-325: 5-325 | 5 days supply | Qty: 30 | Fill #0

## 2015-10-17 SURGICAL SUPPLY — 39 items
APL SKNCLS STERI-STRIP NONHPOA (GAUZE/BANDAGES/DRESSINGS)
APPLICATOR COTTON TIP 6IN STRL (MISCELLANEOUS) ×4 IMPLANT
APPLIER CLIP ROT 10 11.4 M/L (STAPLE) ×2
APR CLP MED LRG 11.4X10 (STAPLE) ×1
BAG SPEC RTRVL 10 TROC 200 (ENDOMECHANICALS) ×1
BENZOIN TINCTURE PRP APPL 2/3 (GAUZE/BANDAGES/DRESSINGS) IMPLANT
CABLE HIGH FREQUENCY MONO STRZ (ELECTRODE) ×2 IMPLANT
CATH REDDICK CHOLANGI 4FR 50CM (CATHETERS) ×2 IMPLANT
CLIP APPLIE ROT 10 11.4 M/L (STAPLE) ×1 IMPLANT
COVER MAYO STAND STRL (DRAPES) ×2 IMPLANT
COVER SURGICAL LIGHT HANDLE (MISCELLANEOUS) ×2 IMPLANT
DECANTER SPIKE VIAL GLASS SM (MISCELLANEOUS) ×2 IMPLANT
DRAPE C-ARM 42X120 X-RAY (DRAPES) ×2 IMPLANT
ELECT PENCIL ROCKER SW 15FT (MISCELLANEOUS) ×1 IMPLANT
ELECT REM PT RETURN 9FT ADLT (ELECTROSURGICAL) ×2
ELECTRODE REM PT RTRN 9FT ADLT (ELECTROSURGICAL) ×1 IMPLANT
GLOVE BIOGEL M 8.0 STRL (GLOVE) ×2 IMPLANT
GOWN STRL REUS W/TWL XL LVL3 (GOWN DISPOSABLE) ×6 IMPLANT
HEMOSTAT SURGICEL 4X8 (HEMOSTASIS) IMPLANT
IRRIG SUCT STRYKERFLOW 2 WTIP (MISCELLANEOUS) ×2
IRRIGATION SUCT STRKRFLW 2 WTP (MISCELLANEOUS) ×1 IMPLANT
IV CATH 14GX2 1/4 (CATHETERS) ×2 IMPLANT
KIT BASIN OR (CUSTOM PROCEDURE TRAY) ×2 IMPLANT
L-HOOK LAP DISP 36CM (ELECTROSURGICAL) ×2
LHOOK LAP DISP 36CM (ELECTROSURGICAL) IMPLANT
LIQUID BAND (GAUZE/BANDAGES/DRESSINGS) ×2 IMPLANT
POUCH RETRIEVAL ECOSAC 10 (ENDOMECHANICALS) ×1 IMPLANT
POUCH RETRIEVAL ECOSAC 10MM (ENDOMECHANICALS) ×1
SCISSORS LAP 5X45 EPIX DISP (ENDOMECHANICALS) ×2 IMPLANT
SLEEVE XCEL OPT CAN 5 100 (ENDOMECHANICALS) ×3 IMPLANT
STRIP CLOSURE SKIN 1/2X4 (GAUZE/BANDAGES/DRESSINGS) IMPLANT
SUT VIC AB 4-0 SH 18 (SUTURE) ×2 IMPLANT
SYR 20CC LL (SYRINGE) ×2 IMPLANT
TOWEL OR 17X26 10 PK STRL BLUE (TOWEL DISPOSABLE) ×2 IMPLANT
TRAY LAPAROSCOPIC (CUSTOM PROCEDURE TRAY) ×2 IMPLANT
TROCAR BLADELESS OPT 5 100 (ENDOMECHANICALS) ×2 IMPLANT
TROCAR XCEL BLUNT TIP 100MML (ENDOMECHANICALS) IMPLANT
TROCAR XCEL NON-BLD 11X100MML (ENDOMECHANICALS) ×2 IMPLANT
TUBING INSUF HEATED (TUBING) ×2 IMPLANT

## 2015-10-17 NOTE — Anesthesia Postprocedure Evaluation (Signed)
Anesthesia Post Note  Patient: Curtis Dorsey  Procedure(s) Performed: Procedure(s) (LRB): LAPAROSCOPIC CHOLECYSTECTOMY (N/A)  Patient location during evaluation: PACU Anesthesia Type: General Level of consciousness: sedated and patient cooperative Pain management: pain level controlled Vital Signs Assessment: post-procedure vital signs reviewed and stable Respiratory status: spontaneous breathing Cardiovascular status: stable Anesthetic complications: no    Last Vitals:  Vitals:   10/17/15 1355 10/17/15 1442  BP: (!) 138/92 132/85  Pulse: 67 72  Resp: 16 18  Temp: 36.5 C 36.3 C    Last Pain:  Vitals:   10/17/15 1442  TempSrc: Oral  PainSc: [place]

## 2015-10-17 NOTE — H&P (Signed)
Curtis Dorsey. Dwight 10/10/2015 10:30 AM Location: [facility] Patient #: [id number] DOB: 1955-10-13 Married / Language: English / Race: White Male   History of Present Illness Curtis Dorsey B. Hassell Done MD; 10/10/2015 10:56 AM) The patient is a 60 year old male who presents for evaluation of gall stones. The onset of the gall stones has been acute (09/13/15 was seen in the emergency room for right upper quadrant abdominal pain of about 2-3 hours duration after eating some chicken wings. At that time a 1.8 cm gallstone was lodged in the gallbladder neck with borderline wall thickening but no other findings suggestive of acute cholecystitis. His pain resolved but he's had lesser flares since then. He also notes that these probably had these symptoms for some time but they went as chest fullness or bloating or reflux.) and has been occurring for 3 hours. The gall stones is described as moderate.  I discussed laparoscopic cholecystectomy with him in detail including complications done in limited to common bile duct injury or bile leaks. I gave him a booklet on laparoscopic cholecystectomy to read at his leisure. I described laparoscopic and open cholecystectomy. He wants to proceed with surgery. He is planning a trip to [place] on September 16 so would be good if we can get this done next week if possible. I have previously done a right inguinal hernia repair on him. He's had a previous robotic prostatectomy.   Other Problems Malachi Bonds, CMA; 10/10/2015 10:59 AM) Cholelithiasis Gastroesophageal Reflux Disease Hemorrhoids Hypercholesterolemia Inguinal Hernia Prostate Cancer  Past Surgical History Malachi Bonds, CMA; 10/10/2015 10:59 AM) Open Inguinal Hernia Surgery Bilateral. Oral Surgery Prostate Surgery - Removal  Allergies Malachi Bonds, CMA; 10/10/2015 10:30 AM) No Known Drug Allergies08/25/2017  Medication History Malachi Bonds, CMA; 10/10/2015 10:31 AM) RaNITidine HCl  (150MG  Tablet, Oral) Active. Ibuprofen (200MG  Capsule, Oral) Active. Sudafed 12 Hour (120MG  Tablet ER 12HR, Oral) Active. Medications Reconciled  Social History Malachi Bonds, CMA; 10/10/2015 10:59 AM) Alcohol use Moderate alcohol use. Caffeine use Carbonated beverages, Tea. Illicit drug use Remotely quit drug use.  Family History Malachi Bonds, CMA; 10/10/2015 10:59 AM) Colon Cancer Mother. Hypertension Mother. Kidney Disease Mother.    Review of Systems Malachi Bonds CMA; 10/10/2015 10:59 AM) General Not Present- Appetite Loss, Chills, Fatigue, Fever, Night Sweats, Weight Gain and Weight Loss. Skin Not Present- Change in Wart/Mole, Dryness, Hives, Jaundice, New Lesions, Non-Healing Wounds, Rash and Ulcer. HEENT Present- Ringing in the Ears and Sinus Pain. Not Present- Earache, Hearing Loss, Hoarseness, Nose Bleed, Oral Ulcers, Seasonal Allergies, Sore Throat, Visual Disturbances, Wears glasses/contact lenses and Yellow Eyes. Respiratory Present- Snoring. Not Present- Bloody sputum, Chronic Cough, Difficulty Breathing and Wheezing. Breast Not Present- Breast Mass, Breast Pain, Nipple Discharge and Skin Changes. Cardiovascular Not Present- Chest Pain, Difficulty Breathing Lying Down, Leg Cramps, Palpitations, Rapid Heart Rate, Shortness of Breath and Swelling of Extremities. Gastrointestinal Present- Abdominal Pain, Bloating, Hemorrhoids and Indigestion. Not Present- Bloody Stool, Change in Bowel Habits, Chronic diarrhea, Constipation, Difficulty Swallowing, Excessive gas, Gets full quickly at meals, Nausea, Rectal Pain and Vomiting. Male Genitourinary Present- Impotence, Urgency and Urine Leakage. Not Present- Blood in Urine, Change in Urinary Stream, Frequency, Nocturia and Painful Urination. Musculoskeletal Present- Joint Stiffness. Not Present- Back Pain, Joint Pain, Muscle Pain, Muscle Weakness and Swelling of Extremities. Neurological Not Present- Decreased Memory, Fainting,  Headaches, Numbness, Seizures, Tingling, Tremor, Trouble walking and Weakness. Psychiatric Not Present- Anxiety, Bipolar, Change in Sleep Pattern, Depression, Fearful and Frequent crying. Endocrine Not Present- Cold Intolerance, Excessive  Hunger, Hair Changes, Heat Intolerance, Hot flashes and New Diabetes. Hematology Not Present- Blood Thinners, Easy Bruising, Excessive bleeding, Gland problems, HIV and Persistent Infections.  Vitals (Chemira Jones CMA; 10/10/2015 10:30 AM) 10/10/2015 10:30 AM Weight: 195.6 lb Height: 72in Body Surface Area: 2.11 m Body Mass Index: 26.53 kg/m  Temp.: 98.70F(Oral)  Pulse: 68 (Regular)  BP: 104/70 (Sitting, Left Arm, Standard)       Physical Exam (Anyah Swallow B. Hassell Done MD; 10/10/2015 10:57 AM) The physical exam findings are as follows: Note:WDWNWM NAD HEENT unremarkable Neck supple Chest clear Heart SR without murmurs Abdomen nontender at present Ext FROM Neuro alert and oriented x 3    Assessment & Plan Curtis Dorsey B. Hassell Done MD; 10/10/2015 11:00 AM) GALLSTONES (K80.20) Impression: Will proceed with scheduling lap chole next week if possible.

## 2015-10-17 NOTE — Discharge Instructions (Signed)
Laparoscopic Cholecystectomy Laparoscopic cholecystectomy is surgery to remove the gallbladder. The gallbladder is located in the upper right part of the abdomen, behind the liver. It is a storage sac for bile, which is produced in the liver. Bile aids in the digestion and absorption of fats. Cholecystectomy is often done for inflammation of the gallbladder (cholecystitis). This condition is usually caused by a buildup of gallstones (cholelithiasis) in the gallbladder. Gallstones can block the flow of bile, and that can result in inflammation and pain. In severe cases, emergency surgery may be required. If emergency surgery is not required, you will have time to prepare for the procedure. Laparoscopic surgery is an alternative to open surgery. Laparoscopic surgery has a shorter recovery time. Your common bile duct may also need to be examined during the procedure. If stones are found in the common bile duct, they may be removed. LET YOUR HEALTH CARE PROVIDER KNOW ABOUT:  Any allergies you have.  All medicines you are taking, including vitamins, herbs, eye drops, creams, and over-the-counter medicines.  Previous problems you or members of your family have had with the use of anesthetics.  Any blood disorders you have.  Previous surgeries you have had.  Any medical conditions you have. RISKS AND COMPLICATIONS Generally, this is a safe procedure. However, problems may occur, including:  Infection.  Bleeding.  Allergic reactions to medicines.  Damage to other structures or organs.  A stone remaining in the common bile duct.  A bile leak from the cyst duct that is clipped when your gallbladder is removed.  The need to convert to open surgery, which requires a larger incision in the abdomen. This may be necessary if your surgeon thinks that it is not safe to continue with a laparoscopic procedure. BEFORE THE PROCEDURE  Ask your health care provider about:  Changing or stopping your  regular medicines. This is especially important if you are taking diabetes medicines or blood thinners.  Taking medicines such as aspirin and ibuprofen. These medicines can thin your blood. Do not take these medicines before your procedure if your health care provider instructs you not to.  Follow instructions from your health care provider about eating or drinking restrictions.  Let your health care provider know if you develop a cold or an infection before surgery.  Plan to have someone take you home after the procedure.  Ask your health care provider how your surgical site will be marked or identified.  You may be given antibiotic medicine to help prevent infection. PROCEDURE  To reduce your risk of infection:  Your health care team will wash or sanitize their hands.  Your skin will be washed with soap.  An IV tube may be inserted into one of your veins.  You will be given a medicine to make you fall asleep (general anesthetic).  A breathing tube will be placed in your mouth.  The surgeon will make several small cuts (incisions) in your abdomen.  A thin, lighted tube (laparoscope) that has a tiny camera on the end will be inserted through one of the small incisions. The camera on the laparoscope will send a picture to a TV screen (monitor) in the operating room. This will give the surgeon a good view inside your abdomen.  A gas will be pumped into your abdomen. This will expand your abdomen to give the surgeon more room to perform the surgery.  Other tools that are needed for the procedure will be inserted through the other incisions. The gallbladder will   be removed through one of the incisions.  After your gallbladder has been removed, the incisions will be closed with stitches (sutures), staples, or skin glue.  Your incisions may be covered with a bandage (dressing). The procedure may vary among health care providers and hospitals. AFTER THE PROCEDURE  Your blood  pressure, heart rate, breathing rate, and blood oxygen level will be monitored often until the medicines you were given have worn off.  You will be given medicines as needed to control your pain.   This information is not intended to replace advice given to you by your health care provider. Make sure you discuss any questions you have with your health care provider.   Document Released: 02/01/2005 Document Revised: 10/23/2014 Document Reviewed: 09/13/2012 Elsevier Interactive Patient Education 2016 [employer]  

## 2015-10-17 NOTE — Anesthesia Preprocedure Evaluation (Signed)
Anesthesia Evaluation  Patient identified by MRN, date of birth, ID band Patient awake    Reviewed: Allergy & Precautions, H&P , NPO status , Patient's Chart, lab work & pertinent test results  Airway Mallampati: II  TM Distance: >3 FB Neck ROM: Full    Dental no notable dental hx.    Pulmonary neg pulmonary ROS,    Pulmonary exam normal breath sounds clear to auscultation       Cardiovascular negative cardio ROS Normal cardiovascular exam Rhythm:Regular Rate:Normal     Neuro/Psych negative neurological ROS  negative psych ROS   GI/Hepatic Neg liver ROS, hiatal hernia, GERD  ,  Endo/Other  negative endocrine ROS  Renal/GU negative Renal ROS     Musculoskeletal negative musculoskeletal ROS (+)   Abdominal   Peds  Hematology negative hematology ROS (+)   Anesthesia Other Findings   Reproductive/Obstetrics negative OB ROS                             Anesthesia Physical  Anesthesia Plan  ASA: II  Anesthesia Plan: General   Post-op Pain Management:    Induction: Intravenous  Airway Management Planned: Oral ETT  Additional Equipment:   Intra-op Plan:   Post-operative Plan: Extubation in OR  Informed Consent: I have reviewed the patients History and Physical, chart, labs and discussed the procedure including the risks, benefits and alternatives for the proposed anesthesia with the patient or authorized representative who has indicated his/her understanding and acceptance.   Dental advisory given  Plan Discussed with: CRNA  Anesthesia Plan Comments:         Anesthesia Quick Evaluation

## 2015-10-17 NOTE — Op Note (Signed)
SMILEY BLOSSER   10/17/2015  1:10 PM  Procedure: Laparoscopic Cholecystectomy with intraoperative cholangiogram  Surgeon: Catalina Antigua B. 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  The patient was prepped with PCMX and draped sterilely. A time out was performed.  Access to the abdomen was achieved with a 5 mm Optiview through the right upper quadrant.  Port placement included a 5 mm in the umbilicus, a 5 in the right lower area and a 11 mm in the upper midline.  Pelvic adhesions noted from prior prostatectomy.    The gallbladder was visualized and the fundus was grasped and the gallbladder was elevated. Traction on the infundibulum allowed for successful demonstration of the critical view. Inflammatory changes were fatty.  The cystic duct was not identified initially but was transected in the fat up near the gallbladder.   I found the distal end and attempted to cannulate but it was too tiny to cannulate.  It was double clipped after being dissected out.   The cystic artery was double clipped and divided.   Removal of the gallbladder from the gallbladder bed was straightforward.  The gallbladder was then placed in a bag and brought out through  The 11 mm trocar sites. The gallbladder bed was inspected and no bleeding or bile leaks were seen.  The 11 mm trocar site was closed with 0 vicryl.     Incisions were injected with Exparel and closed with 4-0 Vicryl and Liquiban on the skin.  Sponge and needle count were correct.    The patient was taken to the recovery room in satisfactory condition.

## 2015-10-17 NOTE — Anesthesia Procedure Notes (Signed)
Procedure Name: Intubation Date/Time: 10/17/2015 11:48 AM Performed by: Dione Booze Pre-anesthesia Checklist: Emergency Drugs available, Patient identified, Suction available and Patient being monitored Patient Re-evaluated:Patient Re-evaluated prior to inductionOxygen Delivery Method: Circle system utilized Preoxygenation: Pre-oxygenation with 100% oxygen Intubation Type: IV induction Ventilation: Mask ventilation without difficulty Laryngoscope Size: 4 and Mac Grade View: Grade II Tube type: Oral Tube size: 7.5 mm Number of attempts: 1 Airway Equipment and Method: Stylet Placement Confirmation: ETT inserted through vocal cords under direct vision,  positive ETCO2 and breath sounds checked- equal and bilateral Secured at: 22 cm Tube secured with: Tape Dental Injury: Teeth and Oropharynx as per pre-operative assessment

## 2015-10-17 NOTE — Anesthesia Postprocedure Evaluation (Signed)
Anesthesia Post Note  Patient: Curtis Dorsey  Procedure(s) Performed: Procedure(s) (LRB): LAPAROSCOPIC CHOLECYSTECTOMY (N/A)  Patient location during evaluation: PACU Anesthesia Type: General Level of consciousness: sedated and patient cooperative Pain management: pain level controlled Vital Signs Assessment: post-procedure vital signs reviewed and stable Respiratory status: spontaneous breathing Cardiovascular status: stable Anesthetic complications: no    Last Vitals:  Vitals:   10/17/15 1355 10/17/15 1442  BP: (!) 138/92 132/85  Pulse: 67 72  Resp: 16 18  Temp: 36.5 C 36.3 C    Last Pain:  Vitals:   10/17/15 1442  TempSrc: Oral  PainSc: [place]

## 2015-10-17 NOTE — Transfer of Care (Signed)
Immediate Anesthesia Transfer of Care Note  Patient: Curtis Dorsey  Procedure(s) Performed: Procedure(s): LAPAROSCOPIC CHOLECYSTECTOMY (N/A)  Patient Location: PACU  Anesthesia Type:General  Level of Consciousness: awake and patient cooperative  Airway & Oxygen Therapy: Patient Spontanous Breathing and Patient connected to face mask oxygen  Post-op Assessment: Report given to RN and Post -op Vital signs reviewed and stable  Post vital signs: Reviewed and stable  Last Vitals:  Vitals:   10/17/15 0947  BP: (!) 126/92  Pulse: 64  Resp: 18  Temp: 36.6 C    Last Pain:  Vitals:   10/17/15 0947  TempSrc: Oral         Complications: No apparent anesthesia complications

## 2015-10-17 NOTE — Interval H&P Note (Signed)
History and Physical Interval Note:  10/17/2015 11:38 AM  Curtis Dorsey  has presented today for surgery, with the diagnosis of gallstones  The various methods of treatment have been discussed with the patient and family. After consideration of risks, benefits and other options for treatment, the patient has consented to  Procedure(s): LAPAROSCOPIC CHOLECYSTECTOMY WITH INTRAOPERATIVE CHOLANGIOGRAM (N/A) as a surgical intervention .  The patient's history has been reviewed, patient examined, no change in status, stable for surgery.  I have reviewed the patient's chart and labs.  Questions were answered to the patient's satisfaction.     Brendalee Matthies B

## 2015-10-17 NOTE — Progress Notes (Signed)
Due to esophogeal stricture, Mr. Rominger refused po Tylenol tablets, he did take the pre-op meds that were capsules with carefull swallowing.

## 2019-03-05 ENCOUNTER — Other Ambulatory Visit: Payer: Self-pay

## 2019-03-05 ENCOUNTER — Ambulatory Visit
Admission: EM | Admit: 2019-03-05 | Discharge: 2019-03-05 | Disposition: A | Discharge status: Home / Self Care | Payer: BLUE CROSS/BLUE SHIELD | Attending: Physician Assistant | Admitting: Physician Assistant

## 2019-03-05 ENCOUNTER — Ambulatory Visit (INDEPENDENT_AMBULATORY_CARE_PROVIDER_SITE_OTHER): Payer: BLUE CROSS/BLUE SHIELD

## 2019-03-05 DIAGNOSIS — S99922A Unspecified injury of left foot, initial encounter: Secondary | ICD-10-CM | POA: Diagnosis not present

## 2019-03-05 DIAGNOSIS — W1789XA Other fall from one level to another, initial encounter: Secondary | ICD-10-CM

## 2019-03-05 DIAGNOSIS — M79672 Pain in left foot: Secondary | ICD-10-CM

## 2019-03-05 NOTE — ED Triage Notes (Signed)
Patient reports falling through the ceiling (approx. 42ft.) 6-7 weeks ago landed on feet.  He thinks he may have fractured his foot.  Since that time tingling in toes and discoloration of left foot has developed. He denies taking any medication for pain.

## 2019-03-05 NOTE — Discharge Instructions (Signed)
Xray negative. Ice compress, elevation, loose shoes. Continue to monitor for worsening symptoms. If showing signs of infection, spreading redness, warmth, fever, follow up for reevaluation needed.

## 2019-03-05 NOTE — ED Provider Notes (Signed)
[facility]    CSN: [id number] Arrival date & time: 03/05/19  1420      History   Chief Complaint No chief complaint on file.   HPI Curtis Dorsey is a 64 y.o. male.   64 year old male comes in for left foot numbness/tingling and toe discoloration for the past few days.  Patient states 6 to 7 weeks ago, fell through the ceiling, and landed on his feet.  Denies any head injury, loss of consciousness.  Was able to ambulate on own after incident.  Denies any obvious swelling, erythema, warmth.  States since then, has had numbness, tingling that is intermittent.  Has toe discoloration without much pain.  Denies spreading of discoloration.  Denies decrease in range of motion.  Has not taken anything for the symptoms.     Past Medical History:  Diagnosis Date  . Cancer [facility])    prostate 2010  . Esophageal stricture   . GERD (gastroesophageal reflux disease)   . H/O hiatal hernia   . Hx of inguinal hernia surgery    right; will have surgery 09/13/2012    Patient Active Problem List   Diagnosis Date Noted  . S/P laparoscopic cholecystectomy Sept 2017 10/17/2015  . Hemorrhoid 11/29/2012  . S/P right inguinal hernia repair July 2014 09/13/2012  . H/O prostate cancer-post robotic prostatectomy 2010 07/06/2012    Past Surgical History:  Procedure Laterality Date  . CHOLECYSTECTOMY N/A 10/17/2015   Procedure: LAPAROSCOPIC CHOLECYSTECTOMY;  Surgeon: Johnathan Hausen, MD;  Location: [facility];  Service: General;  Laterality: N/A;  . ESOPHAGOGASTRODUODENOSCOPY N/A 09/01/2012   Procedure: ESOPHAGOGASTRODUODENOSCOPY (EGD);  Surgeon: Winfield Cunas., MD;  Location: [facility];  Service: Endoscopy;  Laterality: N/A;  need xray  . HERNIA REPAIR  1980 s   Dr. Hassell Done  . INGUINAL HERNIA REPAIR Right 09/13/2012   Procedure: OPEN RIGHT INGUINAL HERNIA REPAIR;  Surgeon: Pedro Earls, MD;  Location: [facility];  Service: General;  Laterality: Right;  With Mesh  . 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 Leander Rams onc, removed   . PROSTATECTOMY    . SAVORY DILATION N/A 09/01/2012   Procedure: SAVORY DILATION;  Surgeon: Winfield Cunas., MD;  Location: [facility];  Service: Endoscopy;  Laterality: N/A;  . [place]   and adenoidectomy        Home Medications    Prior to Admission medications   Medication Sig Start Date End Date Taking? Authorizing Provider  ibuprofen (ADVIL,MOTRIN) 200 MG tablet Take 400 mg by mouth every 6 (six) hours as needed for pain.     [provider]  phenylephrine (SUDAFED PE) 10 MG TABS Take 10 mg by mouth every 4 (four) hours as needed (congestion).     [provider]    Family History Family History  Problem Relation Age of Onset  . Cancer Mother        colohn ca  . Parkinson's disease Father   . Cancer Paternal Grandmother        colon ca    Social History Social History   Tobacco Use  . Smoking status: Never Smoker  . Smokeless tobacco: Never Used  Substance Use Topics  . Alcohol use: Yes    Comment: 3-4 drinks a day beers per day   . Drug use: No     Allergies   Patient has no known allergies.   Review of Systems Review of Systems  Reason  unable to perform ROS: See HPI as above.     Physical Exam Triage Vital Signs ED Triage Vitals  Enc Vitals Group     BP 03/05/19 1503 (!) 139/94     Pulse Rate 03/05/19 1503 77     Resp 03/05/19 1503 16     Temp 03/05/19 1503 98.2 F (36.8 C)     Temp Source 03/05/19 1503 Oral     SpO2 03/05/19 1503 97 %     Weight --      Height --      Head Circumference --      Peak Flow --      Pain Score 03/05/19 1504 1     Pain Loc --      Pain Edu? --      Excl. in [place]? --    No data found.  Updated Vital Signs BP (!) 139/94 (BP Location: Left Arm)   Pulse 77   Temp 98.2 F (36.8 C) (Oral)   Resp 16   SpO2 97%   Physical Exam Constitutional:      General: He is not in acute distress.    Appearance: Normal appearance. He is  well-developed. He is not toxic-appearing or diaphoretic.  HENT:     Head: Normocephalic and atraumatic.  Eyes:     Conjunctiva/sclera: Conjunctivae normal.     Pupils: Pupils are equal, round, and reactive to light.  Pulmonary:     Effort: Pulmonary effort is normal. No respiratory distress.     Comments: Speaking in full sentences without difficulty Musculoskeletal:     Cervical back: Normal range of motion and neck supple.     Comments: See picture below. ? Blisters. No tenderness to palpation of the ankle, foot, toe. Full ROM. Strength normal and equal bilaterally. Sensation intact and equal bilaterally. Pedal pulse 2+, cap refill <2s  Skin:    General: Skin is warm and dry.  Neurological:     Mental Status: He is alert and oriented to person, place, and time.        UC Treatments / Results  Labs (all labs ordered are listed, but only abnormal results are displayed) Labs Reviewed - No data to display  EKG   Radiology DG Foot Complete Left  Result Date: 03/05/2019 CLINICAL DATA:  [place] from ceiling and landed on foot discoloration to the toe EXAM: LEFT FOOT - COMPLETE 3+ VIEW COMPARISON:  None. FINDINGS: No acute displaced fracture or malalignment. Joint space narrowing at the IP and the IP joints. IMPRESSION: No acute osseous abnormality. Electronically Signed   By: Donavan Foil M.D.   On: 03/05/2019 15:51    Procedures Procedures (including critical care time)  Medications Ordered in UC Medications - No data to display  Initial Impression / Assessment and Plan / UC Course  I have reviewed the triage vital signs and the nursing notes.  Pertinent labs & imaging results that were available during my care of the patient were reviewed by me and considered in my medical decision making (see chart for details).    Although without tenderness to palpation, given significant fall 6 to 7 weeks ago, will obtain x-ray for further evaluation.  X-ray negative for fracture or  dislocation. ?  Inflammation causing symptoms.  Discoloration of the toe is suspicious for blisters.  Discussed to take NSAIDs, ice compress, and avoid close toe shoes for now to prevent friction.  Return precautions given.  Patient expresses understanding and agrees to plan.  Final  Clinical Impressions(s) / UC Diagnoses   Final diagnoses:  Injury of left foot, initial encounter   ED Prescriptions    None     PDMP not reviewed this encounter.   Ok Edwards, PA-C 03/05/19 1725

## 2019-03-05 NOTE — ED Notes (Signed)
Patient updated on delays for rooming.

## 2019-05-11 ENCOUNTER — Ambulatory Visit: Payer: BLUE CROSS/BLUE SHIELD | Attending: Internal Medicine

## 2019-05-11 DIAGNOSIS — Z23 Encounter for immunization: Secondary | ICD-10-CM

## 2019-05-11 NOTE — Progress Notes (Signed)
   Covid-19 Vaccination Clinic  Name:  Curtis Dorsey    MRN: [id number] DOB: [birth year]  05/11/2019  Curtis Dorsey was observed post Covid-19 immunization for 15 minutes without incident. He was provided with Vaccine Information Sheet and instruction to access the V-Safe system.   Curtis Dorsey was instructed to call 911 with any severe reactions post vaccine: Marland Kitchen Difficulty breathing  . Swelling of face and throat  . A fast heartbeat  . A bad rash all over body  . Dizziness and weakness   Immunizations Administered    Name Date Dose VIS Date Route   Pfizer COVID-19 Vaccine 05/11/2019 11:37 AM 0.3 mL 01/26/2019 Intramuscular   Manufacturer: [place]   Lot: [id number]   [place]: [id number]

## 2019-06-04 ENCOUNTER — Ambulatory Visit: Payer: BLUE CROSS/BLUE SHIELD | Attending: Internal Medicine

## 2019-06-04 DIAGNOSIS — Z23 Encounter for immunization: Secondary | ICD-10-CM

## 2019-06-04 NOTE — Progress Notes (Signed)
   Covid-19 Vaccination Clinic  Name:  Curtis Dorsey    MRN: [id number] DOB: [birth year]  06/04/2019  [name] was observed post Covid-19 immunization for 15 minutes without incident. He was provided with Vaccine Information Sheet and instruction to access the V-Safe system.   [name] was instructed to call 911 with any severe reactions post vaccine: Marland Kitchen Difficulty breathing  . Swelling of face and throat  . A fast heartbeat  . A bad rash all over body  . Dizziness and weakness   Immunizations Administered    Name Date Dose VIS Date Route   Pfizer COVID-19 Vaccine 06/04/2019  3:40 PM 0.3 mL 04/11/2018 Intramuscular   Manufacturer: [place]   Lot: [id number]   [place]: [id number]

## 2020-01-29 ENCOUNTER — Ambulatory Visit
Admission: EM | Admit: 2020-01-29 | Discharge: 2020-01-29 | Disposition: A | Discharge status: Home / Self Care | Payer: BLUE CROSS/BLUE SHIELD | Attending: Emergency Medicine | Admitting: Emergency Medicine

## 2020-01-29 ENCOUNTER — Other Ambulatory Visit: Payer: Self-pay

## 2020-01-29 DIAGNOSIS — R059 Cough, unspecified: Secondary | ICD-10-CM | POA: Diagnosis not present

## 2020-01-29 NOTE — Discharge Instructions (Signed)
Your COVID test is pending - it is important to quarantine / isolate at home until your results are back. °If you test positive and would like further evaluation for persistent or worsening symptoms, you may schedule an E-visit or virtual (video) visit throughout the [facility] MyChart app or website. ° °PLEASE NOTE: If you develop severe chest pain or shortness of breath please go to the ER or call 9-1-1 for further evaluation --> DO NOT schedule electronic or virtual visits for this. °Please call our office for further guidance / recommendations as needed. ° °For information about the Covid vaccine, please visit [email or web address] °

## 2020-01-29 NOTE — ED Provider Notes (Signed)
[facility]    CSN: [id number] Arrival date & time: 01/29/20  1324      History   Chief Complaint Chief Complaint  Patient presents with  . Cough    HPI Curtis Dorsey is a 64 y.o. male  Presenting for Covid testing.  Endorsing cough, malaise for the last 3 days.  States cough is nonproductive and without wheezing.  Overall feels symptoms are mild, though wants "to make sure is not Covid".  Past Medical History:  Diagnosis Date  . Cancer [facility])    prostate 2010  . Esophageal stricture   . GERD (gastroesophageal reflux disease)   . H/O hiatal hernia   . Hx of inguinal hernia surgery    right; will have surgery 09/13/2012    Patient Active Problem List   Diagnosis Date Noted  . S/P laparoscopic cholecystectomy Sept 2017 10/17/2015  . Hemorrhoid 11/29/2012  . S/P right inguinal hernia repair July 2014 09/13/2012  . H/O prostate cancer-post robotic prostatectomy 2010 07/06/2012    Past Surgical History:  Procedure Laterality Date  . CHOLECYSTECTOMY N/A 10/17/2015   Procedure: LAPAROSCOPIC CHOLECYSTECTOMY;  Surgeon: Johnathan Hausen, MD;  Location: [facility];  Service: General;  Laterality: N/A;  . ESOPHAGOGASTRODUODENOSCOPY N/A 09/01/2012   Procedure: ESOPHAGOGASTRODUODENOSCOPY (EGD);  Surgeon: Winfield Cunas., MD;  Location: [facility];  Service: Endoscopy;  Laterality: N/A;  need xray  . HERNIA REPAIR  1980 s   Dr. Hassell Done  . INGUINAL HERNIA REPAIR Right 09/13/2012   Procedure: OPEN RIGHT INGUINAL HERNIA REPAIR;  Surgeon: Pedro Earls, MD;  Location: [facility];  Service: General;  Laterality: Right;  With Mesh  . 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 PROSTATECTOMY    . SAVORY DILATION N/A 09/01/2012   Procedure: SAVORY DILATION;  Surgeon: Winfield Cunas., MD;  Location: [facility];  Service: Endoscopy;  Laterality: N/A;  . TONSILLECTOMY AND ADENOIDECTOMY  1963   and adenoidectomy        Home Medications    Prior to Admission  medications   Not on File    Family History Family History  Problem Relation Age of Onset  . Cancer Mother        colohn ca  . Parkinson's disease Father   . Cancer Paternal Grandmother        colon ca    Social History Social History   Tobacco Use  . Smoking status: Never Smoker  . Smokeless tobacco: Never Used  Vaping Use  . Vaping Use: Never used  Substance Use Topics  . Alcohol use: Yes    Comment: daily  . Drug use: No     Allergies   Patient has no known allergies.   Review of Systems Review of Systems  Constitutional: Negative for fatigue and fever.  HENT: Negative for dental problem, ear pain, facial swelling, hearing loss, sinus pain, sore throat, trouble swallowing and voice change.   Eyes: Negative for photophobia, pain and visual disturbance.  Respiratory: Positive for cough. Negative for shortness of breath.   Cardiovascular: Negative for chest pain and palpitations.  Gastrointestinal: Negative for diarrhea and vomiting.  Musculoskeletal: Negative for arthralgias and myalgias.  Neurological: Negative for dizziness and headaches.     Physical Exam Triage Vital Signs ED Triage Vitals  Enc Vitals Group     BP 01/29/20 1343 (!) 144/93     Pulse Rate 01/29/20 1343 79     Resp 01/29/20 1343  18     Temp 01/29/20 1343 98.3 F (36.8 C)     Temp Source 01/29/20 1343 Oral     SpO2 01/29/20 1343 96 %     Weight 01/29/20 1341 192 lb (87.1 kg)     Height 01/29/20 1341 6' (1.829 m)     Head Circumference --      Peak Flow --      Pain Score 01/29/20 1341 0     Pain Loc --      Pain Edu? --      Excl. in [place]? --    No data found.  Updated Vital Signs BP (!) 144/93 (BP Location: Left Arm)   Pulse 79   Temp 98.3 F (36.8 C) (Oral)   Resp 18   Ht 6' (1.829 m)   Wt 192 lb (87.1 kg)   SpO2 96%   BMI 26.04 kg/m   Visual Acuity Right Eye Distance:   Left Eye Distance:   Bilateral Distance:    Right Eye Near:   Left Eye Near:    Bilateral  Near:     Physical Exam Constitutional:      General: He is not in acute distress.    Appearance: He is not toxic-appearing or diaphoretic.  HENT:     Head: Normocephalic and atraumatic.     Right Ear: Tympanic membrane and ear canal normal.     Left Ear: Tympanic membrane and ear canal normal.     Mouth/Throat:     Mouth: Mucous membranes are moist.     Pharynx: Oropharynx is clear.  Eyes:     General: No scleral icterus.    Conjunctiva/sclera: Conjunctivae normal.     Pupils: Pupils are equal, round, and reactive to light.  Neck:     Comments: Trachea midline, negative JVD Cardiovascular:     Rate and Rhythm: Normal rate and regular rhythm.  Pulmonary:     Effort: Pulmonary effort is normal. No respiratory distress.     Breath sounds: No wheezing.  Musculoskeletal:     Cervical back: Neck supple. No tenderness.  Lymphadenopathy:     Cervical: No cervical adenopathy.  Skin:    Capillary Refill: Capillary refill takes less than 2 seconds.     Coloration: Skin is not jaundiced or pale.     Findings: No rash.  Neurological:     Mental Status: He is alert and oriented to person, place, and time.      UC Treatments / Results  Labs (all labs ordered are listed, but only abnormal results are displayed) Labs Reviewed  NOVEL CORONAVIRUS, NAA    EKG   Radiology No results found.  Procedures Procedures (including critical care time)  Medications Ordered in UC Medications - No data to display  Initial Impression / Assessment and Plan / UC Course  I have reviewed the triage vital signs and the nursing notes.  Pertinent labs & imaging results that were available during my care of the patient were reviewed by me and considered in my medical decision making (see chart for details).     Patient afebrile, nontoxic, with SpO2 96%.  Covid PCR pending.  Patient to quarantine until results are back.  We will treat supportively as outlined below.  Return precautions  discussed, patient verbalized understanding and is agreeable to plan. Final Clinical Impressions(s) / UC Diagnoses   Final diagnoses:  Cough     Discharge Instructions     Your COVID test is pending - it  is important to quarantine / isolate at home until your results are back. If you test positive and would like further evaluation for persistent or worsening symptoms, you may schedule an E-visit or virtual (video) visit throughout the [facility] app or website.  PLEASE NOTE: If you develop severe chest pain or shortness of breath please go to the ER or call 9-1-1 for further evaluation --> DO NOT schedule electronic or virtual visits for this. Please call our office for further guidance / recommendations as needed.  For information about the Covid vaccine, please visit [email or web address]    ED Prescriptions    None     PDMP not reviewed this encounter.   [place], [place], [place] 01/29/20 1545

## 2020-01-29 NOTE — ED Triage Notes (Signed)
Pt c/o flu like sx x 3 days-NAD-steady gait ?

## 2020-02-01 LAB — NOVEL CORONAVIRUS, NAA: SARS-CoV-2, NAA: DETECTED — AB

## 2020-02-01 LAB — SARS-COV-2, NAA 2 DAY TAT

## 2020-02-03 ENCOUNTER — Telehealth (HOSPITAL_COMMUNITY): Payer: Self-pay

## 2020-02-03 NOTE — Telephone Encounter (Signed)
Called to Discuss with patient about Covid symptoms and the use of the monoclonal antibody infusion for those with mild to moderate Covid symptoms and at a high risk of hospitalization.     Pt appears to qualify for this infusion due to co-morbid conditions and/or a member of an at-risk group in accordance with the FDA Emergency Use Authorization.    Unable to reach pt, VM left

## 2020-03-06 ENCOUNTER — Telehealth (INDEPENDENT_AMBULATORY_CARE_PROVIDER_SITE_OTHER): Payer: BC Managed Care – PPO | Admitting: Internal Medicine

## 2020-03-06 ENCOUNTER — Ambulatory Visit (INDEPENDENT_AMBULATORY_CARE_PROVIDER_SITE_OTHER): Payer: BC Managed Care – PPO

## 2020-03-06 ENCOUNTER — Other Ambulatory Visit: Payer: Self-pay

## 2020-03-06 ENCOUNTER — Encounter: Payer: Self-pay | Admitting: Internal Medicine

## 2020-03-06 VITALS — BP 144/92 | HR 67 | Temp 98.2°F | Resp 18 | Ht 72.0 in | Wt 192.8 lb

## 2020-03-06 DIAGNOSIS — Z1211 Encounter for screening for malignant neoplasm of colon: Secondary | ICD-10-CM

## 2020-03-06 DIAGNOSIS — M79641 Pain in right hand: Secondary | ICD-10-CM

## 2020-03-06 DIAGNOSIS — Z7689 Persons encountering health services in other specified circumstances: Secondary | ICD-10-CM | POA: Diagnosis not present

## 2020-03-06 DIAGNOSIS — M79642 Pain in left hand: Secondary | ICD-10-CM

## 2020-03-06 DIAGNOSIS — E785 Hyperlipidemia, unspecified: Secondary | ICD-10-CM | POA: Diagnosis not present

## 2020-03-06 DIAGNOSIS — Z2821 Immunization not carried out because of patient refusal: Secondary | ICD-10-CM

## 2020-03-06 DIAGNOSIS — Z7189 Other specified counseling: Secondary | ICD-10-CM | POA: Diagnosis not present

## 2020-03-06 DIAGNOSIS — R030 Elevated blood-pressure reading, without diagnosis of hypertension: Secondary | ICD-10-CM

## 2020-03-06 NOTE — Patient Instructions (Signed)

## 2020-03-06 NOTE — Progress Notes (Signed)
Subjective:    Curtis Dorsey - 65 y.o. male MRN [id number]  Date of birth: 11-09-1955  HPI  Curtis Dorsey is to establish care. Patient has not seen a PCP for about 10 years. Patient has a PMH significant for prostate cancer---followed by urology once per year. S/p robotic prostatectomy and cholecystectomy. Has had two hernia repairs, left and right inguinal. Reports history of high cholesterol. Reports has been borderline hypertensive. Family history of HTN in his mother and paternal grandfather.   Reports he had colonoscopy which was about 10 years ago and was normal. Followed previously by [facility].   Endorses pain in both hands with swelling at finger joints. Worse in the morning with stiffness. Has done repetitive movements with hands for most of his life.   ROS per HPI   Health Maintenance:  Health Maintenance Due  Topic Date Due  . Hepatitis C Screening  Never done  . HIV Screening  Never done  . COLONOSCOPY (Pts 45-88yrs Insurance coverage will need to be confirmed)  Never done  . COVID-19 Vaccine (3 - Pfizer risk 4-dose series) 07/02/2019     Past Medical History: Patient Active Problem List   Diagnosis Date Noted  . S/P laparoscopic cholecystectomy Sept 2017 10/17/2015  . Hemorrhoid 11/29/2012  . S/P right inguinal hernia repair July 2014 09/13/2012  . H/O prostate cancer-post robotic prostatectomy 2010 07/06/2012    Social History   reports that he has never smoked. He has never used smokeless tobacco. He reports current alcohol use. He reports that he does not use drugs.   Family History  family history includes Cancer in his mother and paternal grandmother; Parkinson's disease in his father.   Medications: reviewed and updated   Objective:   Physical Exam BP (!) 146/98 (BP Location: Right Arm, Patient Position: Sitting, Cuff Size: Normal)   Pulse 67   Temp 98.2 F (36.8 C) (Oral)   Resp 18   Ht 6' (1.829 m)   Wt 192 lb 12.8 oz (87.5 kg)   SpO2 95%    BMI 26.15 kg/m  Physical Exam Constitutional:      General: He is not in acute distress.    Appearance: He is not diaphoretic.  HENT:     Head: Normocephalic and atraumatic.  Eyes:     Extraocular Movements: EOM normal.     Conjunctiva/sclera: Conjunctivae normal.  Cardiovascular:     Rate and Rhythm: Normal rate and regular rhythm.     Heart sounds: Normal heart sounds. No murmur heard.   Pulmonary:     Effort: Pulmonary effort is normal. No respiratory distress.     Breath sounds: Normal breath sounds.  Musculoskeletal:        General: Normal range of motion.  Skin:    General: Skin is warm and dry.  Neurological:     Mental Status: He is alert and oriented to person, place, and time.  Psychiatric:        Mood and Affect: Affect normal.        Judgment: Judgment normal.         Assessment & Plan:    1. Encounter to establish care Reviewed patient's PMH, social history, surgical history, and medications.    2. Hyperlipidemia, unspecified hyperlipidemia type   3. Counseled about COVID-19 virus infection Discussed that I would recommend receiving COVID booster vaccination for additional protection.   4. Screening for colon cancer - Ambulatory referral to Gastroenterology  5. Influenza vaccination declined by  patient  6. Pain in both hands Will obtain hand xrays to evaluate for OA.  - DG Hand Complete Left; Future - DG Hand Complete Right; Future  7. Elevated blood pressure reading without diagnosis of hypertension BP 146/98>144/92. Continue to monitor. Family h/o HTN. Discussed DASH diet. Follow up within 2-3 months.     Phill Myron, D.O. 03/06/2020, 4:06 PM Primary Care at [facility]

## 2020-03-21 ENCOUNTER — Encounter: Payer: Self-pay | Admitting: Internal Medicine

## 2020-03-21 NOTE — Progress Notes (Signed)
UTC letters sent via MyChart and mail

## 2020-04-25 ENCOUNTER — Ambulatory Visit (INDEPENDENT_AMBULATORY_CARE_PROVIDER_SITE_OTHER): Payer: BC Managed Care – PPO | Admitting: Internal Medicine

## 2020-04-25 ENCOUNTER — Other Ambulatory Visit: Payer: Self-pay

## 2020-04-25 ENCOUNTER — Encounter: Payer: Self-pay | Admitting: Internal Medicine

## 2020-04-25 VITALS — BP 154/97 | HR 65 | Temp 98.1°F | Resp 16 | Ht 71.5 in | Wt 194.0 lb

## 2020-04-25 DIAGNOSIS — Z130 Encounter for screening for diseases of the blood and blood-forming organs and certain disorders involving the immune mechanism: Secondary | ICD-10-CM

## 2020-04-25 DIAGNOSIS — Z13228 Encounter for screening for other metabolic disorders: Secondary | ICD-10-CM

## 2020-04-25 DIAGNOSIS — Z114 Encounter for screening for human immunodeficiency virus [HIV]: Secondary | ICD-10-CM

## 2020-04-25 DIAGNOSIS — Z0000 Encounter for general adult medical examination without abnormal findings: Secondary | ICD-10-CM | POA: Diagnosis not present

## 2020-04-25 DIAGNOSIS — W57XXXS Bitten or stung by nonvenomous insect and other nonvenomous arthropods, sequela: Secondary | ICD-10-CM

## 2020-04-25 DIAGNOSIS — Z1159 Encounter for screening for other viral diseases: Secondary | ICD-10-CM | POA: Diagnosis not present

## 2020-04-25 DIAGNOSIS — I10 Essential (primary) hypertension: Secondary | ICD-10-CM

## 2020-04-25 MED ORDER — HYDROCHLOROTHIAZIDE 12.5 MG PO CAPS: 12.5000 mg | ORAL_CAPSULE | Freq: Every day | ORAL | 1 refills | Status: DC

## 2020-04-25 NOTE — Progress Notes (Signed)
Subjective:    Curtis Dorsey - 65 y.o. male MRN [id number]  Date of birth: 12/10/1955  HPI  Curtis Dorsey is here for annual exam. Asks for lyme disease testing for history of multiple tick bites.      Health Maintenance:  Health Maintenance Due  Topic Date Due  . Hepatitis C Screening  Never done  . HIV Screening  Never done  . COLONOSCOPY (Pts 45-48yrs Insurance coverage will need to be confirmed)  Never done  . COVID-19 Vaccine (3 - Pfizer risk 4-dose series) 07/02/2019    -  reports that he has never smoked. He has never used smokeless tobacco. - Review of Systems: Per HPI. - Past Medical History: Patient Active Problem List   Diagnosis Date Noted  . S/P laparoscopic cholecystectomy Sept 2017 10/17/2015  . Hemorrhoid 11/29/2012  . S/P right inguinal hernia repair July 2014 09/13/2012  . H/O prostate cancer-post robotic prostatectomy 2010 07/06/2012   - Medications: reviewed and updated   Objective:   Physical Exam BP (!) 154/97 (BP Location: Right Arm, Cuff Size: Normal)   Pulse 65   Temp 98.1 F (36.7 C)   Resp 16   Ht 5' 11.5" (1.816 m)   Wt 194 lb (88 kg)   SpO2 98%   BMI 26.68 kg/m  Physical Exam Constitutional:      Appearance: He is not diaphoretic.  HENT:     Head: Normocephalic and atraumatic.  Eyes:     Conjunctiva/sclera: Conjunctivae normal.     Pupils: Pupils are equal, round, and reactive to light.  Neck:     Thyroid: No thyromegaly.  Cardiovascular:     Rate and Rhythm: Normal rate and regular rhythm.     Heart sounds: Normal heart sounds. No murmur heard.   Pulmonary:     Effort: Pulmonary effort is normal. No respiratory distress.     Breath sounds: Normal breath sounds. No wheezing.  Abdominal:     General: Bowel sounds are normal. There is no distension.     Palpations: Abdomen is soft.     Tenderness: There is no abdominal tenderness. There is no guarding or rebound.  Musculoskeletal:        General: No deformity. Normal  range of motion.     Cervical back: Normal range of motion and neck supple.  Lymphadenopathy:     Cervical: No cervical adenopathy.  Skin:    General: Skin is warm and dry.     Findings: No rash.  Neurological:     Mental Status: He is alert and oriented to person, place, and time.     Gait: Gait is intact.  Psychiatric:        Mood and Affect: Mood and affect normal.        Judgment: Judgment normal.            Assessment & Plan:  1. Encounter for annual physical exam Counseled on 150 minutes of exercise per week, healthy eating (including decreased daily intake of saturated fats, cholesterol, added sugars, sodium), STI prevention, routine healthcare maintenance.  2. Screening for metabolic disorder - Comprehensive metabolic panel - Lipid panel  3. Screening for deficiency anemia - CBC  4. Need for hepatitis C screening test - Hepatitis C antibody  5. Screening for HIV (human immunodeficiency virus) - HIV Antibody (routine testing w rflx)  6. Tick bite, unspecified site, sequela - Lyme disease dna by pcr(borrelia burg)  7. Essential hypertension BP remains above goal on subsequent  office visit. Will start HCTZ for newly diagnosed HTN. Encouraged monitoring at home. Discussed DASH diet and exercise.  - hydrochlorothiazide (MICROZIDE) 12.5 MG capsule; Take 1 capsule (12.5 mg total) by mouth daily.  Dispense: 30 capsule; Refill: 1 - Basic Metabolic Panel; Future  Phill Myron, D.O. 04/25/2020, 8:59 AM Primary Care at [facility]

## 2020-04-25 NOTE — Progress Notes (Signed)
No concerns today 

## 2020-04-25 NOTE — Patient Instructions (Addendum)

## 2020-04-28 ENCOUNTER — Other Ambulatory Visit: Payer: Self-pay | Admitting: Internal Medicine

## 2020-04-28 DIAGNOSIS — E785 Hyperlipidemia, unspecified: Secondary | ICD-10-CM | POA: Insufficient documentation

## 2020-04-28 LAB — HIV ANTIBODY (ROUTINE TESTING W REFLEX): HIV Screen 4th Generation wRfx: NONREACTIVE

## 2020-04-28 LAB — LYME DISEASE DNA BY PCR(BORRELIA BURG): Lyme (B. burgdorferi) PCR: NEGATIVE

## 2020-04-28 LAB — LIPID PANEL
Chol/HDL Ratio: 4.1 ratio (ref 0.0–5.0)
Cholesterol, Total: 269 mg/dL — ABNORMAL HIGH (ref 100–199)
HDL: 66 mg/dL (ref >39)
LDL Chol Calc (NIH): 176 mg/dL — ABNORMAL HIGH (ref 0–99)
Triglycerides: 149 mg/dL (ref 0–149)
VLDL Cholesterol Cal: 27 mg/dL (ref 5–40)

## 2020-04-28 LAB — CBC
Hematocrit: 51.8 % — ABNORMAL HIGH (ref 37.5–51.0)
Hemoglobin: 17.4 g/dL (ref 13.0–17.7)
MCH: 31 pg (ref 26.6–33.0)
MCHC: 33.6 g/dL (ref 31.5–35.7)
MCV: 92 fL (ref 79–97)
Platelets: 242 10*3/uL (ref 150–450)
RBC: 5.61 x10E6/uL (ref 4.14–5.80)
RDW: 13.4 % (ref 11.6–15.4)
WBC: 5.9 10*3/uL (ref 3.4–10.8)

## 2020-04-28 LAB — COMPREHENSIVE METABOLIC PANEL
ALT: 23 IU/L (ref 0–44)
AST: 16 IU/L (ref 0–40)
Albumin/Globulin Ratio: 1.8 (ref 1.2–2.2)
Albumin: 4.1 g/dL (ref 3.8–4.8)
Alkaline Phosphatase: 78 IU/L (ref 44–121)
BUN/Creatinine Ratio: 7 — ABNORMAL LOW (ref 10–24)
BUN: 7 mg/dL — ABNORMAL LOW (ref 8–27)
Bilirubin Total: 0.8 mg/dL (ref 0.0–1.2)
CO2: 25 mmol/L (ref 20–29)
Calcium: 9.4 mg/dL (ref 8.6–10.2)
Chloride: 100 mmol/L (ref 96–106)
Creatinine, Ser: 1.02 mg/dL (ref 0.76–1.27)
Globulin, Total: 2.3 g/dL (ref 1.5–4.5)
Glucose: 95 mg/dL (ref 65–99)
Potassium: 4.5 mmol/L (ref 3.5–5.2)
Sodium: 140 mmol/L (ref 134–144)
Total Protein: 6.4 g/dL (ref 6.0–8.5)
eGFR: 82 mL/min/{1.73_m2} (ref >59)

## 2020-04-28 LAB — SPECIMEN STATUS REPORT

## 2020-04-28 LAB — HEPATITIS C ANTIBODY: Hep C Virus Ab: 0.2 s/co ratio (ref 0.0–0.9)

## 2020-04-28 MED ORDER — ASPIRIN 81 MG PO TBEC: 81.0000 mg | DELAYED_RELEASE_TABLET | Freq: Every day | ORAL | 12 refills | Status: AC

## 2020-04-28 MED ORDER — ATORVASTATIN CALCIUM 40 MG PO TABS: 40.0000 mg | ORAL_TABLET | Freq: Every day | ORAL | 3 refills | Status: DC

## 2020-05-05 ENCOUNTER — Encounter: Payer: Self-pay | Admitting: *Deleted

## 2020-05-09 ENCOUNTER — Other Ambulatory Visit: Payer: Self-pay | Admitting: Internal Medicine

## 2020-05-09 ENCOUNTER — Ambulatory Visit: Payer: BC Managed Care – PPO

## 2020-05-09 ENCOUNTER — Other Ambulatory Visit: Payer: Self-pay

## 2020-05-09 VITALS — BP 142/95

## 2020-05-09 DIAGNOSIS — I10 Essential (primary) hypertension: Secondary | ICD-10-CM

## 2020-05-09 MED ORDER — HYDROCHLOROTHIAZIDE 25 MG PO TABS: 25.0000 mg | ORAL_TABLET | Freq: Every day | ORAL | 3 refills | Status: AC

## 2020-05-09 NOTE — Progress Notes (Signed)
Pt arrived to [facility] . Pt alert and oriented and arrives in good spirits. Last OV was on 04/25/2020 with Dr. Juleen China. His BP was  154/97. Pt denies chest pain, SOB, HA, dizziness, or blurred vision.  Verified medication. He also has his BP monitor from home. His reading was 150/93.Today's BP readings are above goal.  Informed Dr. Juleen China and BP medication was increased.  Patient instructed to take 2 pills of current dose at home and then pick up next dose from pharmacy. Patient verbalized understanding.

## 2020-06-17 ENCOUNTER — Other Ambulatory Visit: Payer: Self-pay | Admitting: Physician Assistant

## 2020-06-17 DIAGNOSIS — R1310 Dysphagia, unspecified: Secondary | ICD-10-CM

## 2020-07-08 ENCOUNTER — Other Ambulatory Visit: Payer: Self-pay | Admitting: Physician Assistant

## 2020-07-08 ENCOUNTER — Ambulatory Visit
Admission: RE | Admit: 2020-07-08 | Discharge: 2020-07-08 | Disposition: A | Discharge status: Home / Self Care | Payer: BC Managed Care – PPO | Source: Ambulatory Visit | Attending: Physician Assistant | Admitting: Physician Assistant

## 2020-07-08 DIAGNOSIS — R1310 Dysphagia, unspecified: Secondary | ICD-10-CM

## 2020-07-09 ENCOUNTER — Other Ambulatory Visit: Payer: Self-pay

## 2020-07-09 ENCOUNTER — Other Ambulatory Visit: Payer: Self-pay | Admitting: Gastroenterology

## 2020-07-09 NOTE — Progress Notes (Signed)
Attempted to obtain medical history via telephone, unable to reach at this time. I left a voicemail to return pre surgical testing department's phone call.  

## 2020-07-16 ENCOUNTER — Encounter (HOSPITAL_COMMUNITY)
Admission: RE | Disposition: A | Discharge status: Home / Self Care | Payer: Self-pay | Source: Home / Self Care | Attending: Gastroenterology

## 2020-07-16 ENCOUNTER — Ambulatory Visit (HOSPITAL_COMMUNITY)
Admission: RE | Admit: 2020-07-16 | Discharge: 2020-07-16 | Disposition: A | Discharge status: Home / Self Care | Payer: BC Managed Care – PPO | Attending: Gastroenterology | Admitting: Gastroenterology

## 2020-07-16 ENCOUNTER — Ambulatory Visit (HOSPITAL_COMMUNITY): Payer: BC Managed Care – PPO | Admitting: Anesthesiology

## 2020-07-16 ENCOUNTER — Other Ambulatory Visit: Payer: Self-pay

## 2020-07-16 DIAGNOSIS — K2950 Unspecified chronic gastritis without bleeding: Secondary | ICD-10-CM | POA: Diagnosis not present

## 2020-07-16 DIAGNOSIS — Z79899 Other long term (current) drug therapy: Secondary | ICD-10-CM | POA: Diagnosis not present

## 2020-07-16 DIAGNOSIS — R933 Abnormal findings on diagnostic imaging of other parts of digestive tract: Secondary | ICD-10-CM | POA: Insufficient documentation

## 2020-07-16 DIAGNOSIS — K222 Esophageal obstruction: Secondary | ICD-10-CM | POA: Insufficient documentation

## 2020-07-16 DIAGNOSIS — K2289 Other specified disease of esophagus: Secondary | ICD-10-CM | POA: Diagnosis not present

## 2020-07-16 DIAGNOSIS — R1310 Dysphagia, unspecified: Secondary | ICD-10-CM | POA: Diagnosis present

## 2020-07-16 DIAGNOSIS — Z7982 Long term (current) use of aspirin: Secondary | ICD-10-CM | POA: Insufficient documentation

## 2020-07-16 HISTORY — PX: ESOPHAGOGASTRODUODENOSCOPY (EGD) WITH PROPOFOL: SHX5813

## 2020-07-16 HISTORY — PX: BALLOON DILATION: SHX5330

## 2020-07-16 HISTORY — PX: BIOPSY: SHX5522

## 2020-07-16 SURGERY — ESOPHAGOGASTRODUODENOSCOPY (EGD) WITH PROPOFOL
Anesthesia: Monitor Anesthesia Care

## 2020-07-16 MED ORDER — PROPOFOL 500 MG/50ML IV EMUL
INTRAVENOUS | Status: AC
  Filled 2020-07-16: qty 50

## 2020-07-16 MED ORDER — PROPOFOL 10 MG/ML IV BOLUS
INTRAVENOUS | Status: DC | PRN
  Administered 2020-07-16: 10 mg via INTRAVENOUS
  Administered 2020-07-16: 20 mg via INTRAVENOUS

## 2020-07-16 MED ORDER — PANTOPRAZOLE SODIUM 40 MG PO TBEC: 40.0000 mg | DELAYED_RELEASE_TABLET | Freq: Two times a day (BID) | ORAL | 2 refills | Status: AC

## 2020-07-16 MED ORDER — SODIUM CHLORIDE 0.9 % IV SOLN: INTRAVENOUS | Status: DC

## 2020-07-16 MED ORDER — PROPOFOL 500 MG/50ML IV EMUL
INTRAVENOUS | Status: DC | PRN
  Administered 2020-07-16: 125 ug/kg/min via INTRAVENOUS

## 2020-07-16 MED ORDER — LACTATED RINGERS IV SOLN: INTRAVENOUS | Status: DC

## 2020-07-16 MED ORDER — ONDANSETRON HCL 4 MG/2ML IJ SOLN
INTRAMUSCULAR | Status: DC | PRN
  Administered 2020-07-16: 4 mg via INTRAVENOUS

## 2020-07-16 MED ORDER — LIDOCAINE 2% (20 MG/ML) 5 ML SYRINGE
INTRAMUSCULAR | Status: DC | PRN
  Administered 2020-07-16: 60 mg via INTRAVENOUS

## 2020-07-16 MED ORDER — SUCRALFATE 1 G PO TABS: 1.0000 g | ORAL_TABLET | Freq: Two times a day (BID) | ORAL | 0 refills | Status: AC

## 2020-07-16 SURGICAL SUPPLY — 15 items

## 2020-07-16 NOTE — Transfer of Care (Signed)
Immediate Anesthesia Transfer of Care Note  Patient: Curtis Dorsey  Procedure(s) Performed: ESOPHAGOGASTRODUODENOSCOPY (EGD) WITH PROPOFOL WITH DIL (N/A ) BIOPSY BALLOON DILATION (N/A )  Patient Location: PACU and Endoscopy Unit  Anesthesia Type:MAC  Level of Consciousness: awake, alert  and oriented  Airway & Oxygen Therapy: Patient Spontanous Breathing and Patient connected to face mask oxygen  Post-op Assessment: Report given to RN and Post -op Vital signs reviewed and stable  Post vital signs: Reviewed and stable  Last Vitals:  Vitals Value Taken Time  BP 116/70 07/16/20 1250  Temp    Pulse 57 07/16/20 1252  Resp 14 07/16/20 1252  SpO2 100 % 07/16/20 1252  Vitals shown include unvalidated device data.  Last Pain:  Vitals:   07/16/20 1146  TempSrc: Temporal  PainSc: 0-No pain         Complications: No complications documented.

## 2020-07-16 NOTE — Anesthesia Preprocedure Evaluation (Addendum)
Anesthesia Evaluation  Patient identified by MRN, date of birth, ID band Patient awake    Reviewed: Allergy & Precautions, NPO status , Patient's Chart, lab work & pertinent test results  History of Anesthesia Complications Negative for: history of anesthetic complications  Airway Mallampati: II  TM Distance: >3 FB Neck ROM: Full    Dental no notable dental hx. (+) Dental Advisory Given   Pulmonary neg pulmonary ROS,    Pulmonary exam normal        Cardiovascular hypertension, Pt. on medications Normal cardiovascular exam     Neuro/Psych negative neurological ROS     GI/Hepatic Neg liver ROS, hiatal hernia, GERD  Medicated,  Endo/Other  negative endocrine ROS  Renal/GU negative Renal ROS     Musculoskeletal negative musculoskeletal ROS (+)   Abdominal   Peds  Hematology negative hematology ROS (+)   Anesthesia Other Findings   Reproductive/Obstetrics                            Anesthesia Physical Anesthesia Plan  ASA: II  Anesthesia Plan: MAC   Post-op Pain Management:    Induction: Intravenous  PONV Risk Score and Plan: Ondansetron and Propofol infusion  Airway Management Planned: Natural Airway and Simple Face Mask  Additional Equipment:   Intra-op Plan:   Post-operative Plan:   Informed Consent: I have reviewed the patients History and Physical, chart, labs and discussed the procedure including the risks, benefits and alternatives for the proposed anesthesia with the patient or authorized representative who has indicated his/her understanding and acceptance.     Dental advisory given  Plan Discussed with: Anesthesiologist and CRNA  Anesthesia Plan Comments:        Anesthesia Quick Evaluation

## 2020-07-16 NOTE — Anesthesia Postprocedure Evaluation (Signed)
Anesthesia Post Note  Patient: Curtis Dorsey  Procedure(s) Performed: ESOPHAGOGASTRODUODENOSCOPY (EGD) WITH PROPOFOL WITH DIL (N/A ) BIOPSY BALLOON DILATION (N/A )     Patient location during evaluation: PACU Anesthesia Type: MAC Level of consciousness: awake and alert Pain management: pain level controlled Vital Signs Assessment: post-procedure vital signs reviewed and stable Respiratory status: spontaneous breathing and respiratory function stable Cardiovascular status: stable Postop Assessment: no apparent nausea or vomiting Anesthetic complications: no   No complications documented.  Last Vitals:  Vitals:   07/16/20 1300 07/16/20 1312  BP: 129/81 (!) 146/88  Pulse: (!) 52 60  Resp: 15 16  Temp:    SpO2: 96% 99%    Last Pain:  Vitals:   07/16/20 1312  TempSrc:   PainSc: 0-No pain                 Arda Keadle DANIEL

## 2020-07-16 NOTE — Discharge Instructions (Signed)

## 2020-07-16 NOTE — H&P (Signed)
Primary Care Physician:  Shirline Frees, MD Primary Gastroenterologist:  [facility]  Reason for Visit : EGD for esophageal stricture  HPI: Curtis Dorsey is a 65 y.o. male with past medical history of dysphagia and esophageal stricture requiring dilation in 2010 and 2014 was seen in the office for evaluation of trouble swallowing.  Underwent barium swallow on Jul 08, 2020 which showed moderate to severe stenosis of the GE junction which impeded passage of barium tablet.  Here for outpatient EGD for dilation.  Denies abdominal pain, diarrhea or constipation.  Denies blood in the stool or black stool  Past Medical History:  Diagnosis Date  . Cancer [facility])    prostate 2010  . Esophageal stricture   . GERD (gastroesophageal reflux disease)   . H/O hiatal hernia   . Hx of inguinal hernia surgery    right; will have surgery 09/13/2012    Past Surgical History:  Procedure Laterality Date  . CHOLECYSTECTOMY N/A 10/17/2015   Procedure: LAPAROSCOPIC CHOLECYSTECTOMY;  Surgeon: Johnathan Hausen, MD;  Location: [facility];  Service: General;  Laterality: N/A;  . ESOPHAGOGASTRODUODENOSCOPY N/A 09/01/2012   Procedure: ESOPHAGOGASTRODUODENOSCOPY (EGD);  Surgeon: Winfield Cunas., MD;  Location: [facility];  Service: Endoscopy;  Laterality: N/A;  need xray  . HERNIA REPAIR  1980 s   Dr. Hassell Done  . INGUINAL HERNIA REPAIR Right 09/13/2012   Procedure: OPEN RIGHT INGUINAL HERNIA REPAIR;  Surgeon: Pedro Earls, MD;  Location: [facility];  Service: General;  Laterality: Right;  With Mesh  . 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 PROSTATECTOMY    . SAVORY DILATION N/A 09/01/2012   Procedure: SAVORY DILATION;  Surgeon: Winfield Cunas., MD;  Location: [facility];  Service: Endoscopy;  Laterality: N/A;  . [place]   and adenoidectomy     Prior to Admission medications   Medication Sig Start Date End Date Taking? Authorizing Provider  aspirin (EC-81 ASPIRIN) 81  MG EC tablet Take 1 tablet (81 mg total) by mouth daily. Swallow whole. Patient taking differently: Take 81 mg by mouth in the morning. Swallow whole. 04/28/20  Yes Nicolette Bang, DO  hydrochlorothiazide (HYDRODIURIL) 25 MG tablet Take 1 tablet (25 mg total) by mouth daily. Patient taking differently: Take 25 mg by mouth in the morning. 05/09/20  Yes Nicolette Bang, DO  ibuprofen (ADVIL) 200 MG tablet Take 400 mg by mouth every 8 (eight) hours as needed (pain).   Yes [provider]  nitroGLYCERIN (NITROSTAT) 0.3 MG SL tablet Place 0.3 mg under the tongue daily as needed for spasms. 06/17/20  Yes [provider]  pantoprazole (PROTONIX) 40 MG tablet Take 40 mg by mouth daily before breakfast. 06/17/20  Yes [provider]  atorvastatin (LIPITOR) 40 MG tablet Take 1 tablet (40 mg total) by mouth daily. Patient not taking: No sig reported 04/28/20   Nicolette Bang, DO  pseudoephedrine (SUDAFED) 30 MG tablet Take 30 mg by mouth every 8 (eight) hours as needed for congestion.    [provider]    Scheduled Meds: Continuous Infusions: . sodium chloride    . lactated ringers 10 mL/hr at 07/16/20 1158   PRN Meds:.  Allergies as of 07/09/2020  . (No Known Allergies)    Family History  Problem Relation Age of Onset  . Cancer Mother        colohn ca  . Parkinson's disease Father   .  Cancer Paternal Grandmother        colon ca    Social History   Socioeconomic History  . Marital status: Married    Spouse name: Not on file  . Number of children: Not on file  . Years of education: Not on file  . Highest education level: Not on file  Occupational History  . Not on file  Tobacco Use  . Smoking status: Never Smoker  . Smokeless tobacco: Never Used  Vaping Use  . Vaping Use: Never used  Substance and Sexual Activity  . Alcohol use: Yes    Comment: 25/week  . Drug use: No  . Sexual activity: Not on file  Other Topics  Concern  . Not on file  Social History Narrative  . Not on file   Social Determinants of Health   Financial Resource Strain: Not on file  Food Insecurity: Not on file  Transportation Needs: Not on file  Physical Activity: Not on file  Stress: Not on file  Social Connections: Not on file  Intimate Partner Violence: Not on file    Review of Systems: All negative except as stated above in HPI.  Physical Exam: Vital signs: Vitals:   07/16/20 1146  BP: (!) 166/98  Resp: 12  Temp: (!) 96.8 F (36 C)  SpO2: 100%     General:   Alert,  Well-developed, well-nourished, pleasant and cooperative in NAD Lungs:  Clear throughout to auscultation.   No wheezes, crackles, or rhonchi. No acute distress. Heart:  Regular rate and rhythm; no murmurs, clicks, rubs,  or gallops. Abdomen: Soft, nontender, nondistended, bowel sounds present Rectal:  Deferred  GI:  Lab Results: No results for input(s): WBC, HGB, HCT, PLT in the last 72 hours. BMET No results for input(s): NA, K, CL, CO2, GLUCOSE, BUN, CREATININE, CALCIUM in the last 72 hours. LFT No results for input(s): PROT, ALBUMIN, AST, ALT, ALKPHOS, BILITOT, BILIDIR, IBILI in the last 72 hours. PT/INR No results for input(s): LABPROT, INR in the last 72 hours.   Studies/Results: No results found.  Impression/Plan: -Esophageal stricture -Dysphagia  Recommendations ------------------------ -EGD with possible dilation today.  Risks (bleeding, infection, bowel perforation that could require surgery, sedation-related changes in cardiopulmonary systems), benefits (identification and possible treatment of source of symptoms, exclusion of certain causes of symptoms), and alternatives (watchful waiting, radiographic imaging studies, empiric medical treatment)  were explained to patient/family in detail and patient wishes to proceed.    LOS: 0 days   Otis Brace  MD, [place] 07/16/2020, 12:15 PM  Contact #  [phone]

## 2020-07-16 NOTE — Op Note (Signed)
[facility] Patient Name: Curtis Dorsey Procedure Date: 07/16/2020 MRN: [id number] Attending MD: Otis Brace , MD Date of Birth: [birth year] CSN: [id number] Age: 65 Admit Type: Outpatient Procedure:                Upper GI endoscopy Indications:              Dysphagia, Abnormal esophagram Providers:                Otis Brace, MD, Josie Dixon, RN, Fransico Setters                            [place], Technician Referring MD:              Medicines:                 Complications:            No immediate complications. Estimated Blood Loss:     Estimated blood loss was minimal. Procedure:                Pre-Anesthesia Assessment:                           - Prior to the procedure, a History and Physical                            was performed, and patient medications and                            allergies were reviewed. The patient's tolerance of                            previous anesthesia was also reviewed. The risks                            and benefits of the procedure and the sedation                            options and risks were discussed with the patient.                            All questions were answered, and informed consent                            was obtained. Prior Anticoagulants: The patient has                            taken no previous anticoagulant or antiplatelet                            agents. ASA Grade Assessment: II - A patient with                            mild systemic disease. After reviewing the risks                            and  benefits, the patient was deemed in                            satisfactory condition to undergo the procedure.                           After obtaining informed consent, the endoscope was                            passed under direct vision. Throughout the                            procedure, the patient's blood pressure, pulse, and                            oxygen saturations were  monitored continuously. The                            GIF-H190 ([id number]) Olympus gastroscope was                            introduced through the mouth, and advanced to the                            second part of duodenum. The upper GI endoscopy was                            accomplished without difficulty. The patient                            tolerated the procedure well. Scope In: Scope Out: Findings:      One benign-appearing, intrinsic moderate (circumferential scarring or       stenosis; an endoscope may pass) stenosis was found in the mid       esophagus. The stenosis was traversed.      One benign-appearing, intrinsic severe (stenosis; an endoscope cannot       pass) stenosis was found at the gastroesophageal junction. The stenosis       was traversed after dilation. A TTS dilator was passed through the       scope. Dilation with a 11-26-10 mm balloon dilator was performed to 10       mm. The dilation site was examined following endoscope reinsertion and       showed moderate mucosal disruption, moderate improvement in luminal       narrowing and no perforation.      Localized mild mucosal changes characterized by an abnormal appearance       suspicious for Barrett's were found in the distal esophagus. Biopsies       were taken with a cold forceps for histology.      Diffuse moderate inflammation characterized by congestion (edema),       erythema and friability was found in the entire examined stomach.       Biopsies were taken with a cold forceps for histology.      The cardia and gastric fundus were normal on retroflexion.      The duodenal bulb, first portion of  the duodenum and second portion of       the duodenum were normal. Impression:               - Benign-appearing esophageal stenosis.                           - Benign-appearing esophageal stenosis.                           - Abnormal (rule out Barrett's esophagus) mucosa in                            the  esophagus. Biopsied.                           - Gastritis. Biopsied.                           - Normal duodenal bulb, first portion of the                            duodenum and second portion of the duodenum. Moderate Sedation:      Moderate (conscious) sedation was personally administered by an       anesthesia professional. The following parameters were monitored: oxygen       saturation, heart rate, blood pressure, and response to care. Recommendation:           - Patient has a contact number available for                            emergencies. The signs and symptoms of potential                            delayed complications were discussed with the                            patient. Return to normal activities tomorrow.                            Written discharge instructions were provided to the                            patient.                           - Resume previous diet.                           - Continue present medications.                           - Await pathology results.                           - Repeat upper endoscopy in 4 weeks for retreatment.                           -  Use Protonix (pantoprazole) 40 mg PO BID.                           - Use sucralfate tablets 1 gram PO BID for 4 weeks. Procedure Code(s):        --- Professional ---                           [phone], Esophagogastroduodenoscopy, flexible,                            transoral; with transendoscopic balloon dilation of                            esophagus (less than 30 mm diameter)                           [id number], 59, Esophagogastroduodenoscopy, flexible,                            transoral; with biopsy, single or multiple Diagnosis Code(s):        --- Professional ---                           K22.2, Esophageal obstruction                           K22.8, Other specified diseases of esophagus                           K29.70, Gastritis, unspecified, without bleeding                            R13.10, Dysphagia, unspecified                           R93.3, Abnormal findings on diagnostic imaging of                            other parts of digestive tract CPT copyright 2019 American [employer] Association. All rights reserved. The codes documented in this report are preliminary and upon coder review may  be revised to meet current compliance requirements. Otis Brace, MD Otis Brace, MD 07/16/2020 12:48:50 PM Number of Addenda: 0

## 2020-07-16 NOTE — Anesthesia Procedure Notes (Signed)
Procedure Name: MAC Date/Time: 07/16/2020 12:20 PM Performed by: Maxwell Caul, CRNA Pre-anesthesia Checklist: Patient identified, Emergency Drugs available, Suction available and Patient being monitored Oxygen Delivery Method: Simple face mask

## 2020-07-17 ENCOUNTER — Encounter (HOSPITAL_COMMUNITY): Payer: Self-pay | Admitting: Gastroenterology

## 2020-07-18 LAB — SURGICAL PATHOLOGY

## 2020-07-24 ENCOUNTER — Other Ambulatory Visit: Payer: Self-pay | Admitting: Gastroenterology

## 2020-08-27 NOTE — Progress Notes (Signed)
Attempted to obtain medical history via telephone, unable to reach at this time. I left a voicemail to return pre surgical testing department's phone call.  

## 2020-09-02 ENCOUNTER — Ambulatory Visit (HOSPITAL_COMMUNITY): Payer: BC Managed Care – PPO | Admitting: Anesthesiology

## 2020-09-02 ENCOUNTER — Ambulatory Visit (HOSPITAL_COMMUNITY)
Admission: RE | Admit: 2020-09-02 | Discharge: 2020-09-02 | Disposition: A | Discharge status: Home / Self Care | Payer: BC Managed Care – PPO | Attending: Gastroenterology | Admitting: Gastroenterology

## 2020-09-02 ENCOUNTER — Encounter (HOSPITAL_COMMUNITY)
Admission: RE | Disposition: A | Discharge status: Home / Self Care | Payer: Self-pay | Source: Home / Self Care | Attending: Gastroenterology

## 2020-09-02 ENCOUNTER — Encounter (HOSPITAL_COMMUNITY): Payer: Self-pay | Admitting: Gastroenterology

## 2020-09-02 DIAGNOSIS — K449 Diaphragmatic hernia without obstruction or gangrene: Secondary | ICD-10-CM | POA: Diagnosis not present

## 2020-09-02 DIAGNOSIS — K219 Gastro-esophageal reflux disease without esophagitis: Secondary | ICD-10-CM | POA: Diagnosis not present

## 2020-09-02 DIAGNOSIS — Z7982 Long term (current) use of aspirin: Secondary | ICD-10-CM | POA: Diagnosis not present

## 2020-09-02 DIAGNOSIS — Z79899 Other long term (current) drug therapy: Secondary | ICD-10-CM | POA: Insufficient documentation

## 2020-09-02 DIAGNOSIS — K2970 Gastritis, unspecified, without bleeding: Secondary | ICD-10-CM | POA: Insufficient documentation

## 2020-09-02 DIAGNOSIS — Z8546 Personal history of malignant neoplasm of prostate: Secondary | ICD-10-CM | POA: Insufficient documentation

## 2020-09-02 DIAGNOSIS — Z791 Long term (current) use of non-steroidal anti-inflammatories (NSAID): Secondary | ICD-10-CM | POA: Diagnosis not present

## 2020-09-02 DIAGNOSIS — K222 Esophageal obstruction: Secondary | ICD-10-CM | POA: Insufficient documentation

## 2020-09-02 HISTORY — PX: ESOPHAGOGASTRODUODENOSCOPY (EGD) WITH PROPOFOL: SHX5813

## 2020-09-02 HISTORY — PX: BALLOON DILATION: SHX5330

## 2020-09-02 SURGERY — ESOPHAGOGASTRODUODENOSCOPY (EGD) WITH PROPOFOL
Anesthesia: Monitor Anesthesia Care

## 2020-09-02 MED ORDER — LACTATED RINGERS IV SOLN: INTRAVENOUS | Status: DC

## 2020-09-02 MED ORDER — PROPOFOL 10 MG/ML IV BOLUS
INTRAVENOUS | Status: DC | PRN
  Administered 2020-09-02 (×2): 40 mg via INTRAVENOUS
  Administered 2020-09-02: 20 mg via INTRAVENOUS

## 2020-09-02 MED ORDER — PROPOFOL 500 MG/50ML IV EMUL
INTRAVENOUS | Status: DC | PRN
  Administered 2020-09-02: 150 ug/kg/min via INTRAVENOUS

## 2020-09-02 MED ORDER — LIDOCAINE 2% (20 MG/ML) 5 ML SYRINGE
INTRAMUSCULAR | Status: DC | PRN
  Administered 2020-09-02: 100 mg via INTRAVENOUS

## 2020-09-02 MED ORDER — PROPOFOL 500 MG/50ML IV EMUL
INTRAVENOUS | Status: AC
  Filled 2020-09-02: qty 100

## 2020-09-02 MED ORDER — SODIUM CHLORIDE 0.9 % IV SOLN: INTRAVENOUS | Status: DC

## 2020-09-02 SURGICAL SUPPLY — 15 items

## 2020-09-02 NOTE — Anesthesia Preprocedure Evaluation (Addendum)
Anesthesia Evaluation  Patient identified by MRN, date of birth, ID band Patient awake    Reviewed: Allergy & Precautions, NPO status , Patient's Chart, lab work & pertinent test results  History of Anesthesia Complications Negative for: history of anesthetic complications  Airway Mallampati: II  TM Distance: >3 FB Neck ROM: Full    Dental  (+) Dental Advisory Given, Chipped   Pulmonary neg pulmonary ROS,    Pulmonary exam normal        Cardiovascular hypertension, Pt. on medications Normal cardiovascular exam     Neuro/Psych negative neurological ROS  negative psych ROS   GI/Hepatic Neg liver ROS, hiatal hernia, GERD  Medicated and Controlled, Esophageal stricture    Endo/Other  negative endocrine ROS  Renal/GU negative Renal ROS     Musculoskeletal negative musculoskeletal ROS (+)   Abdominal   Peds  Hematology negative hematology ROS (+)   Anesthesia Other Findings   Reproductive/Obstetrics                            Anesthesia Physical Anesthesia Plan  ASA: 2  Anesthesia Plan: MAC   Post-op Pain Management:    Induction:   PONV Risk Score and Plan: 1 and Propofol infusion and Treatment may vary due to age or medical condition  Airway Management Planned: Nasal Cannula and Natural Airway  Additional Equipment: None  Intra-op Plan:   Post-operative Plan:   Informed Consent: I have reviewed the patients History and Physical, chart, labs and discussed the procedure including the risks, benefits and alternatives for the proposed anesthesia with the patient or authorized representative who has indicated his/her understanding and acceptance.       Plan Discussed with: CRNA and Anesthesiologist  Anesthesia Plan Comments:        Anesthesia Quick Evaluation

## 2020-09-02 NOTE — Transfer of Care (Signed)
Immediate Anesthesia Transfer of Care Note  Patient: Curtis Dorsey  Procedure(s) Performed: ESOPHAGOGASTRODUODENOSCOPY (EGD) WITH PROPOFOL BALLOON DILATION  Patient Location: PACU  Anesthesia Type:MAC  Level of Consciousness: awake  Airway & Oxygen Therapy: Patient Spontanous Breathing  Post-op Assessment: Report given to RN  Post vital signs: Reviewed and stable  Last Vitals:  Vitals Value Taken Time  BP 106/76   Temp    Pulse 62 09/02/20 1109  Resp 12 09/02/20 1109  SpO2 97 % 09/02/20 1109  Vitals shown include unvalidated device data.  Last Pain:  Vitals:   09/02/20 1008  TempSrc: Oral  PainSc: 0-No pain         Complications: No notable events documented.

## 2020-09-02 NOTE — Op Note (Signed)
[facility] Patient Name: Curtis Dorsey Procedure Date: 09/02/2020 MRN: [id number] Attending MD: Otis Brace , MD Date of Birth: [birth year] CSN: [id number] Age: 65 Admit Type: Outpatient Procedure:                Upper GI endoscopy Indications:              For therapy of esophageal stenosis Providers:                Otis Brace, MD, Mariana Arn, Tyna Jaksch                            Technician Referring MD:              Medicines:                Sedation Administered by an Anesthesia Professional Complications:            No immediate complications. Estimated Blood Loss:     Estimated blood loss was minimal. Procedure:                Pre-Anesthesia Assessment:                           - Prior to the procedure, a History and Physical                            was performed, and patient medications and                            allergies were reviewed. The patient's tolerance of                            previous anesthesia was also reviewed. The risks                            and benefits of the procedure and the sedation                            options and risks were discussed with the patient.                            All questions were answered, and informed consent                            was obtained. Prior Anticoagulants: The patient has                            taken no previous anticoagulant or antiplatelet                            agents. ASA Grade Assessment: II - A patient with                            mild systemic disease. After reviewing the risks  and benefits, the patient was deemed in                            satisfactory condition to undergo the procedure.                           After obtaining informed consent, the endoscope was                            passed under direct vision. Throughout the                            procedure, the patient's blood pressure, pulse, and                             oxygen saturations were monitored continuously. The                            GIF-H190 ([id number]) Olympus gastroscope was                            introduced through the mouth, and advanced to the                            second part of duodenum. The upper GI endoscopy was                            accomplished without difficulty. The patient                            tolerated the procedure well. Scope In: Scope Out: Findings:      One benign-appearing, intrinsic moderate (circumferential scarring or       stenosis; an endoscope may pass) stenosis was found at the       gastroesophageal junction. This stenosis measured 9 mm (inner diameter).       The stenosis was traversed with moderate resistance. . A TTS dilator was       passed through the scope. Dilation with a 11-26-10 mm balloon dilator       was performed to 11 mm. The dilation site was examined following       endoscope reinsertion and showed moderate mucosal disruption, moderate       improvement in luminal narrowing and no perforation.      Diffuse moderate inflammation was found in the entire examined stomach.      The cardia and gastric fundus were normal on retroflexion.      The duodenal bulb, first portion of the duodenum and second portion of       the duodenum were normal. Impression:               - Benign-appearing esophageal stenosis. Dilated.                           - Gastritis.                           - Normal duodenal  bulb, first portion of the                            duodenum and second portion of the duodenum.                           - No specimens collected. Moderate Sedation:      Moderate (conscious) sedation was personally administered by an       anesthesia professional. The following parameters were monitored: oxygen       saturation, heart rate, blood pressure, and response to care. Recommendation:           - Patient has a contact number available for                             emergencies. The signs and symptoms of potential                            delayed complications were discussed with the                            patient. Return to normal activities tomorrow.                            Written discharge instructions were provided to the                            patient.                           - Resume previous diet.                           - Continue present medications.                           - Await pathology results.                           - Repeat upper endoscopy at appointment to be                            scheduled for retreatment. Procedure Code(s):        --- Professional ---                           [phone], Esophagogastroduodenoscopy, flexible,                            transoral; with transendoscopic balloon dilation of                            esophagus (less than 30 mm diameter) Diagnosis Code(s):        --- Professional ---                           K22.2, Esophageal obstruction  K29.70, Gastritis, unspecified, without bleeding CPT copyright 2019 American Medical Association. All rights reserved. The codes documented in this report are preliminary and upon coder review may  be revised to meet current compliance requirements. Otis Brace, MD Otis Brace, MD 09/02/2020 11:21:56 AM Number of Addenda: 0

## 2020-09-02 NOTE — Discharge Instructions (Signed)

## 2020-09-02 NOTE — Anesthesia Postprocedure Evaluation (Signed)
Anesthesia Post Note  Patient: Curtis Dorsey  Procedure(s) Performed: ESOPHAGOGASTRODUODENOSCOPY (EGD) WITH PROPOFOL BALLOON DILATION     Patient location during evaluation: PACU Anesthesia Type: MAC Level of consciousness: awake and alert Pain management: pain level controlled Vital Signs Assessment: post-procedure vital signs reviewed and stable Respiratory status: spontaneous breathing, nonlabored ventilation and respiratory function stable Cardiovascular status: stable and blood pressure returned to baseline Anesthetic complications: no   No notable events documented.  Last Vitals:  Vitals:   09/02/20 1120 09/02/20 1130  BP: 115/78 111/89  Pulse: (!) 56 60  Resp: 15 17  Temp:    SpO2: 97% 100%    Last Pain:  Vitals:   09/02/20 1130  TempSrc:   PainSc: 0-No pain                 Audry Pili

## 2020-09-02 NOTE — H&P (Signed)
Primary Care Physician:  Shirline Frees, MD Primary Gastroenterologist:  Dr. Alessandra Bevels  Reason for Visit : Esophageal stricture  HPI: Curtis Dorsey is a 65 y.o. male here for outpatient EGD with dilation for esophageal stricture.  Initially underwent EGD in June 2022 for evaluation of dysphagia.  EGD showed severe distal esophageal stricture and I was not able to advance the scope.  Dilation with 10 mm balloon was performed and was noted to have moderate mucosal disruption.  I was able to advance scope after dilation.   currently taking pantoprazole on a daily basis  Dysphagia has improved since last dilation.  Past Medical History:  Diagnosis Date   Cancer [facility])    prostate 2010   Esophageal stricture    GERD (gastroesophageal reflux disease)    H/O hiatal hernia    Hx of inguinal hernia surgery    right; will have surgery 09/13/2012    Past Surgical History:  Procedure Laterality Date   BALLOON DILATION N/A 07/16/2020   Procedure: BALLOON DILATION;  Surgeon: Otis Brace, MD;  Location: [facility];  Service: Gastroenterology;  Laterality: N/A;   BIOPSY  07/16/2020   Procedure: BIOPSY;  Surgeon: Otis Brace, MD;  [facility];  Service: Gastroenterology;;   CHOLECYSTECTOMY N/A 10/17/2015   Procedure: LAPAROSCOPIC CHOLECYSTECTOMY;  Surgeon: Johnathan Hausen, MD;  Location: [facility];  Service: General;  Laterality: N/A;   ESOPHAGOGASTRODUODENOSCOPY N/A 09/01/2012   Procedure: ESOPHAGOGASTRODUODENOSCOPY (EGD);  Surgeon: Winfield Cunas., MD;  Location[facility];  Service: Endoscopy;  Laterality: N/A;  need xray   ESOPHAGOGASTRODUODENOSCOPY (EGD) WITH PROPOFOL N/A 07/16/2020   Procedure: ESOPHAGOGASTRODUODENOSCOPY (EGD) WITH PROPOFOL WITH DIL;  Surgeon: Otis Brace, MD;  [facility];  Service: Gastroenterology;  Laterality: N/A;   HERNIA REPAIR  1980 s   Dr. 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 Leander Rams onc, removed    PROSTATECTOMY     SAVORY DILATION N/A 09/01/2012   Procedure: Azzie Almas DILATION;  Surgeon: Winfield Cunas., MD;  Location: [facility];  Service: Endoscopy;  Laterality: N/A;   Spiro   and adenoidectomy     Prior to Admission medications   Medication Sig Start Date End Date Taking? Authorizing Provider  aspirin (EC-81 ASPIRIN) 81 MG EC tablet Take 1 tablet (81 mg total) by mouth daily. Swallow whole. Patient taking differently: Take 81 mg by mouth in the morning. Swallow whole. 04/28/20  Yes Nicolette Bang, MD  hydrochlorothiazide (HYDRODIURIL) 25 MG tablet Take 1 tablet (25 mg total) by mouth daily. Patient taking differently: Take 25 mg by mouth in the morning. 05/09/20  Yes Nicolette Bang, MD  ibuprofen (ADVIL) 200 MG tablet Take 400 mg by mouth every 8 (eight) hours as needed (pain).   Yes [provider]  nitroGLYCERIN (NITROSTAT) 0.3 MG SL tablet Place 0.3 mg under the tongue daily as needed for spasms. 06/17/20  Yes [provider]  pantoprazole (PROTONIX) 40 MG tablet Take 1 tablet (40 mg total) by mouth 2 (two) times daily before a meal. 07/16/20 08/28/20 Yes Solveig Fangman, MD  pseudoephedrine (SUDAFED) 30 MG tablet Take 30 mg by mouth every 8 (eight) hours as needed for congestion.   Yes [provider]  atorvastatin (LIPITOR) 40 MG tablet  Take 1 tablet (40 mg total) by mouth daily. 04/28/20   Nicolette Bang, MD  sucralfate (CARAFATE) 1 g tablet Take 1 tablet (1 g total) by mouth 2 (two) times daily. Patient not taking: Reported on 08/28/2020 07/16/20 08/15/20  Otis Brace, MD    Scheduled Meds: Continuous Infusions:  sodium chloride     lactated ringers 10 mL/hr at 09/02/20 1026   PRN Meds:.  Allergies as of 07/24/2020   (No  Known Allergies)    Family History  Problem Relation Age of Onset   Cancer Mother        colohn ca   Parkinson's disease Father    Cancer Paternal Grandmother        colon ca    Social History   Socioeconomic History   Marital status: Married    Spouse name: Not on file   Number of children: Not on file   Years of education: Not on file   Highest education level: Not on file  Occupational History   Not on file  Tobacco Use   Smoking status: Never   Smokeless tobacco: Never  Vaping Use   Vaping Use: Never used  Substance and Sexual Activity   Alcohol use: Yes    Comment: 25/week   Drug use: No   Sexual activity: Not on file  Other Topics Concern   Not on file  Social History Narrative   Not on file   Social Determinants of Health   Financial Resource Strain: Not on file  Food Insecurity: Not on file  Transportation Needs: Not on file  Physical Activity: Not on file  Stress: Not on file  Social Connections: Not on file  Intimate Partner Violence: Not on file    Review of Systems: All negative except as stated above in HPI.  Physical Exam: Vital signs: Vitals:   09/02/20 1008  BP: (!) 152/86  Pulse: 65  Resp: 14  Temp: 97.8 F (36.6 C)  SpO2: 100%     General:   Alert,  Well-developed, well-nourished, pleasant and cooperative in NAD Lungs:  Clear throughout to auscultation.   No wheezes, crackles, or rhonchi. No acute distress. Heart:  Regular rate and rhythm; no murmurs, clicks, rubs,  or gallops. Abdomen: Soft, nontender, nondistended, bowel sound present Rectal:  Deferred  GI:  Lab Results: No results for input(s): WBC, HGB, HCT, PLT in the last 72 hours. BMET No results for input(s): NA, K, CL, CO2, GLUCOSE, BUN, CREATININE, CALCIUM in the last 72 hours. LFT No results for input(s): PROT, ALBUMIN, AST, ALT, ALKPHOS, BILITOT, BILIDIR, IBILI in the last 72 hours. PT/INR No results for input(s): LABPROT, INR in the last 72  hours.   Studies/Results: No results found.  Impression/Plan: -Esophageal stricture causing dysphagia  Recommendation ----------------------- -Proceed with EGD with dilation  Risks (bleeding, infection, bowel perforation that could require surgery, sedation-related changes in cardiopulmonary systems), benefits (identification and possible treatment of source of symptoms, exclusion of certain causes of symptoms), and alternatives (watchful waiting, radiographic imaging studies, empiric medical treatment)  were explained to patient/family in detail and patient wishes to proceed.     LOS: 0 days   Otis Brace  MD, FACP 09/02/2020, 10:33 AM  Contact #  [phone]

## 2020-09-03 ENCOUNTER — Encounter (HOSPITAL_COMMUNITY): Payer: Self-pay | Admitting: Gastroenterology

## 2022-12-16 IMAGING — RF DG ESOPHAGUS
9 series · 14 of 24 positions shown · non-contrast
Comparison: None.

CLINICAL DATA: Dysphagia.  History of esophageal dilatation

EXAM:
ESOPHOGRAM / BARIUM SWALLOW / BARIUM TABLET STUDY
TECHNIQUE: Combined double contrast and single contrast examination performed
using effervescent crystals, thick barium liquid, and thin barium
liquid. The patient was observed with fluoroscopy swallowing a 13 mm
barium sulphate tablet.
FLUOROSCOPY TIME:  Fluoroscopy Time:  2 minutes 48 seconds
Radiation Exposure Index (if provided by the fluoroscopic device): 0
Number of Acquired Spot Images: 9

[Series 1: sequence · 0.28mm/px · 2 of 40 frames shown (1 of 6)]
[frame 7/40]
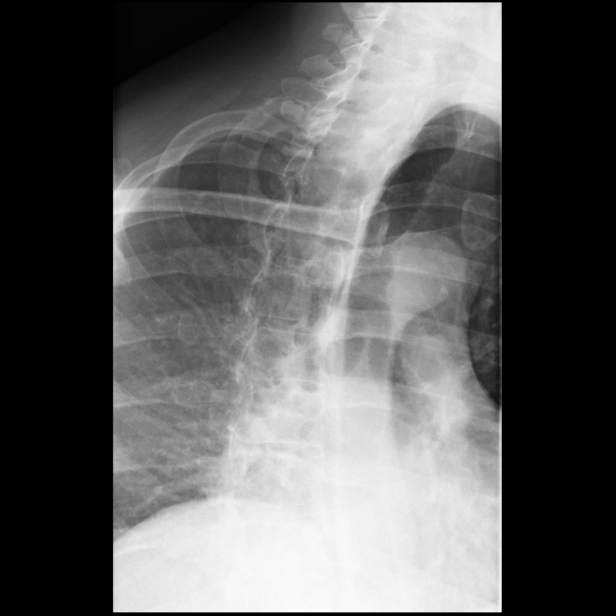
[frame 35/40]
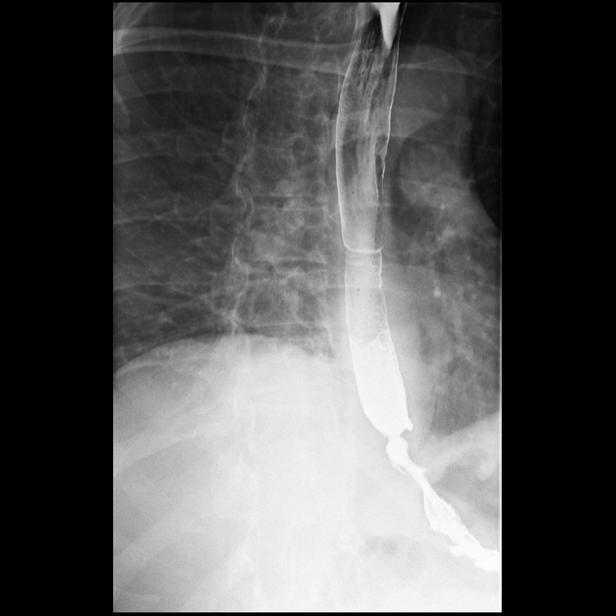

[Series 2: one shot · 1 of 2 slices shown (1 of 3)]
[im 2/2]
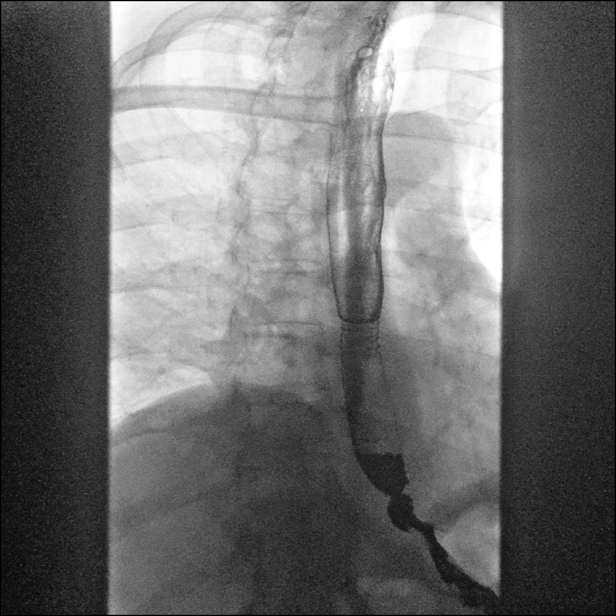

[Series 3: sequence · 2 of 88 frames shown (2 of 6)]
[frame 44/88]
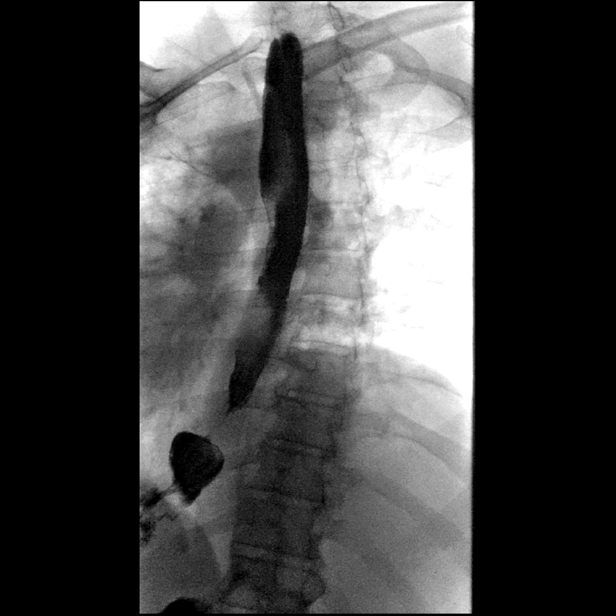
[frame 45/88]
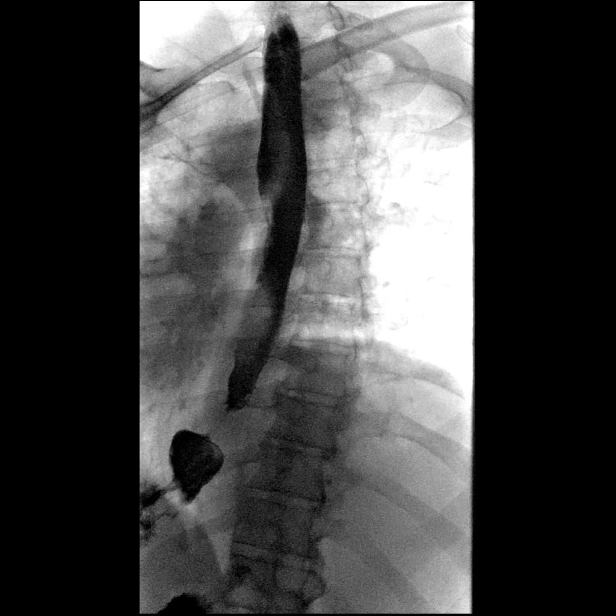

[Series 4: sequence · 2 of 59 frames shown (3 of 6)]
[frame 9/59]
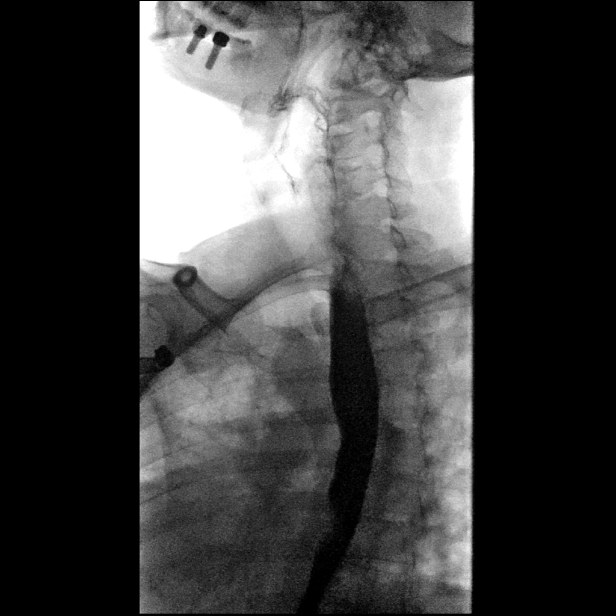
[frame 51/59]
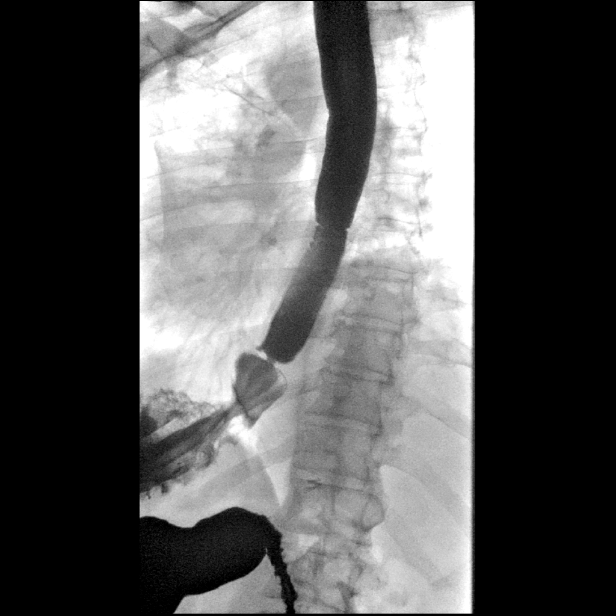

[Series 5: one shot · 1 of 1 slices shown (2 of 3)]
[im 1/1]
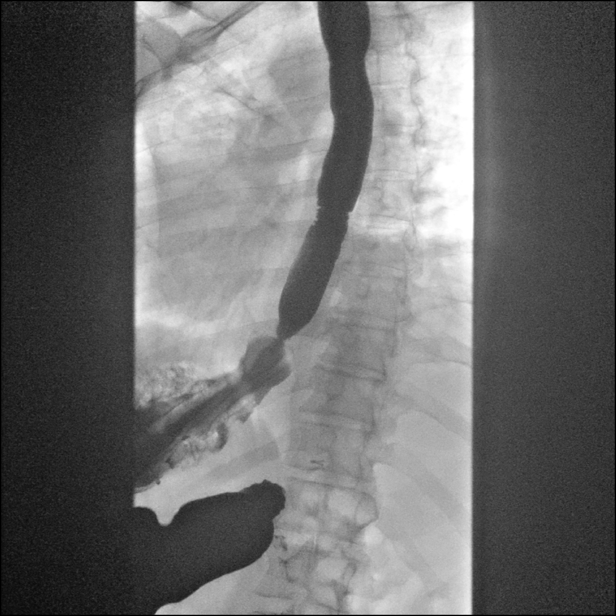

[Series 6: sequence · 1 of 17 frames shown (4 of 6)]
[frame 9/17]
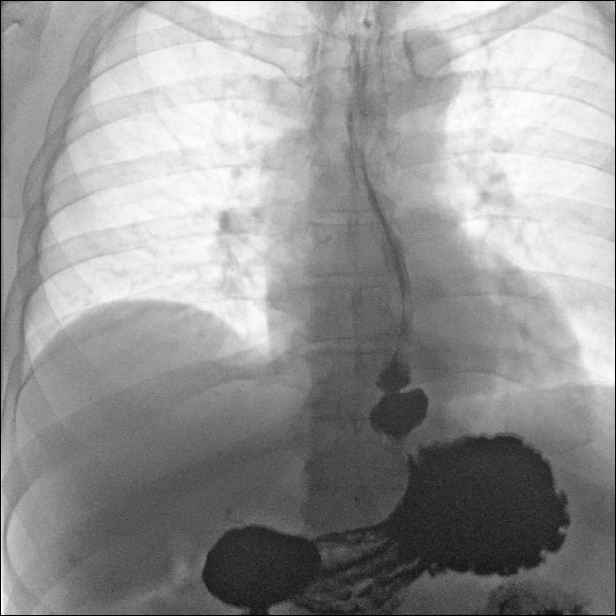

[Series 7: sequence · 2 of 72 frames shown (5 of 6)]
[frame 18/72]
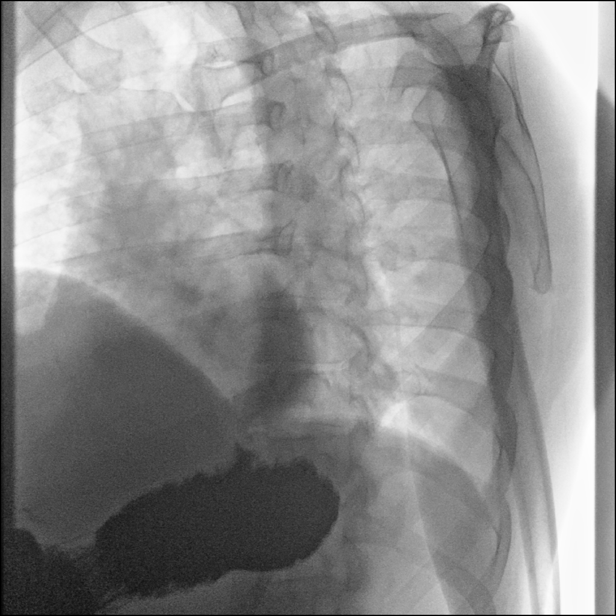
[frame 62/72]
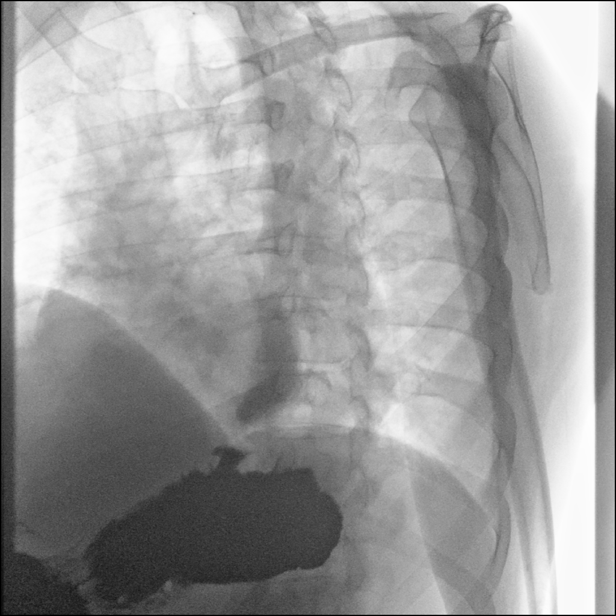

[Series 8: one shot · 1 of 2 slices shown (3 of 3)]
[im 1/2]
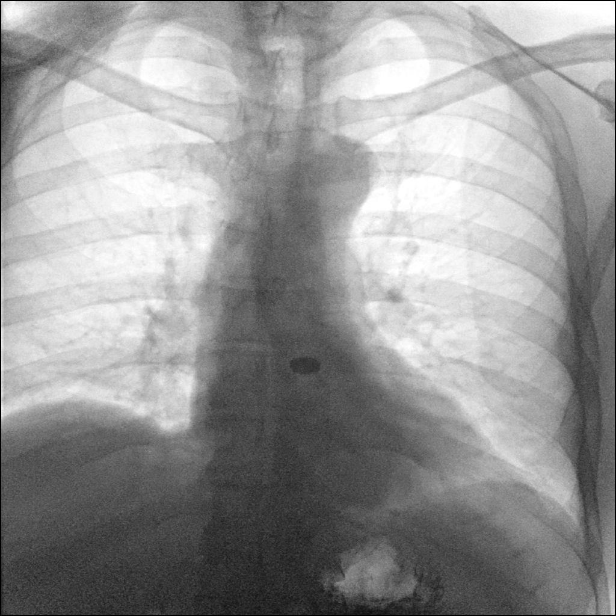

[Series 9: sequence · 2 of 52 frames shown (6 of 6)]
[frame 8/52]
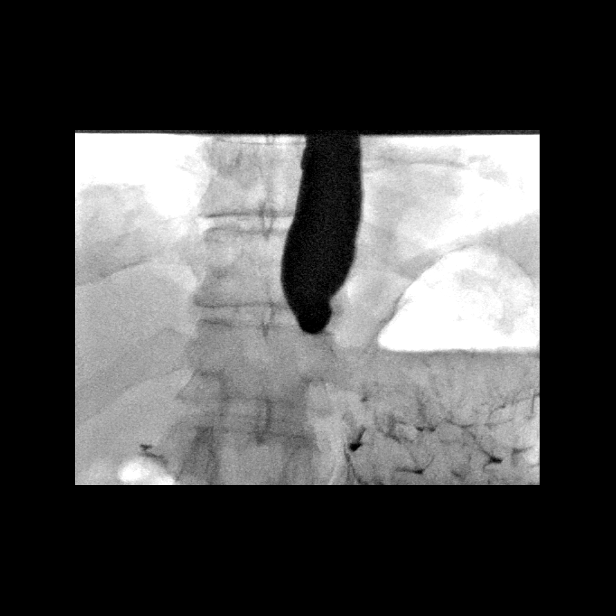
[frame 45/52]
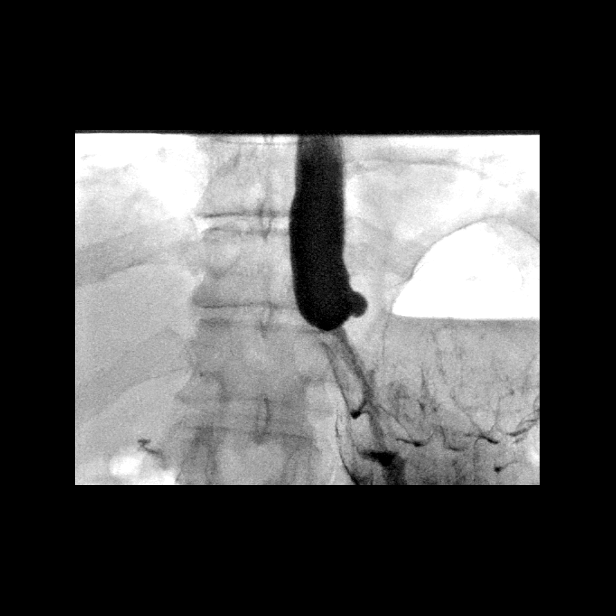

[14 of 24 positions shown; findings below may reference images not displayed]

FINDINGS: Decreased esophageal motility. Esophagus mildly dilated. There is a
mild stricture in the mid esophagus which does not impede passage of
the barium tablet. There is a moderate to severe stricture of the
esophagus at the GE junction which did not impede passage of the
barium tablet.

Transverse mucosal folds are present in the mid and lower thoracic
spine most likely due to gastroesophageal reflux, so-called feline
esophagus.

Trivial gastroesophageal reflux was documented.
IMPRESSION: Mild stricture in the mid esophagus which did not impede passage of
barium tablet. Possible Barrett's esophagus. Recommend endoscopic
evaluation.

Moderate to severe stenosis of the GE junction which did impede
passage of the barium tablet.

Small hiatal hernia.  Mild gastroesophageal reflux.

Feline esophagus

## 2024-01-05 ENCOUNTER — Ambulatory Visit: Admitting: Family Medicine

## 2024-01-05 ENCOUNTER — Encounter: Payer: Self-pay | Admitting: Family Medicine

## 2024-01-05 VITALS — BP 128/83 | HR 64 | Ht 72.0 in | Wt 190.1 lb

## 2024-01-05 DIAGNOSIS — M1990 Unspecified osteoarthritis, unspecified site: Secondary | ICD-10-CM

## 2024-01-05 DIAGNOSIS — M436 Torticollis: Secondary | ICD-10-CM

## 2024-01-05 DIAGNOSIS — Z7689 Persons encountering health services in other specified circumstances: Secondary | ICD-10-CM

## 2024-01-05 DIAGNOSIS — Z13220 Encounter for screening for lipoid disorders: Secondary | ICD-10-CM | POA: Diagnosis not present

## 2024-01-05 DIAGNOSIS — Z8546 Personal history of malignant neoplasm of prostate: Secondary | ICD-10-CM

## 2024-01-05 DIAGNOSIS — Z131 Encounter for screening for diabetes mellitus: Secondary | ICD-10-CM

## 2024-01-05 DIAGNOSIS — Z0000 Encounter for general adult medical examination without abnormal findings: Secondary | ICD-10-CM | POA: Insufficient documentation

## 2024-01-05 NOTE — Assessment & Plan Note (Signed)
 Presents with history of polyarticular joint pain and stiffness, most notably in the hands and neck, present for several years. Physical exam reveals pain with pressure on hand joints and pain with limited range of motion in the cervical spine. - An x-ray of the cervical spine will be ordered to evaluate for degenerative changes. - Counseled on management options for arthritis pain, including topical and oral analgesics. - Discussed safety of Tylenol  and various over-the-counter topical options including NSAID gels (e.g., Voltaren), menthol/lidocaine -based products, and capsaicin cream (noting difficulty of use on hands).

## 2024-01-05 NOTE — Assessment & Plan Note (Signed)
 Patient is establishing care and is due for routine screening. History of hyperlipidemia and daily alcohol use. Family history of colon cancer. - Will obtain baseline labs including CBC, CMP, lipid panel, and A1c. - Advised to follow up with gastroenterology for repeat colonoscopy as due. No new referral needed to see Dr. Ana. - Advised to follow up with urology for continued PSA monitoring. - Discussed need for vaccinations including Tdap, pneumococcal, and shingles vaccines. Advised these are typically administered at a pharmacy under his insurance plan. Noted importance of Tdap for protection of newborn grandchildren. - If labs are normal and no new issues arise, follow up in one year for annual physical. Will arrange earlier follow-up if results are abnormal.

## 2024-01-05 NOTE — Progress Notes (Signed)
 New Patient Office Visit  Subjective   Patient ID: Curtis Dorsey, male    DOB: 05-Dec-1955  Age: 68 y.o. MRN: [id number]  CC:  Chief Complaint  Patient presents with   New Patient (Initial Visit)    HPI Curtis Dorsey presents to establish care  Subjective - New patient establishing care. - Main concerns are self-diagnosed arthritis and neck stiffness. Reports losing range of motion in neck, with constant stiffness and pain on lateral rotation. Arthritis affects hands, hips, and other joints. Reports intermittent, severe pain and stiffness in hands, with inability to fully flex right pinky finger. Symptoms present for several years.  Medications: None. Does not use over-the-counter medications for arthritis.  PMH: History of elevated cholesterol (last tested years ago, no treatment). PSH: Transurethral resection of the prostate, bilateral inguinal hernia repair, cholecystectomy. EGD with dilation and colonoscopy in 2022. FH: Colon cancer (mother and paternal grandfather). Social Hx: Retired from [employer]. Married with two adult children. Reports alcohol use of approximately four 12-ounce beers daily. Occasional cigar use (every few months). No history of cigarette or recreational drug use.  ROS: Musculoskeletal: Positive for joint pain and stiffness in neck, hands, and hips. Reports decreased range of motion in neck and fingers. Negative for recent injury.  Outpatient Encounter Medications as of 01/05/2024  Medication Sig   aspirin  (EC-81 ASPIRIN ) 81 MG EC tablet Take 1 tablet (81 mg total) by mouth daily. Swallow whole. (Patient taking differently: Take 81 mg by mouth in the morning. Swallow whole.)   atorvastatin  (LIPITOR) 40 MG tablet Take 1 tablet (40 mg total) by mouth daily.   hydrochlorothiazide  (HYDRODIURIL ) 25 MG tablet Take 1 tablet (25 mg total) by mouth daily. (Patient taking differently: Take 25 mg by mouth in the morning.)   nitroGLYCERIN (NITROSTAT) 0.3 MG SL tablet  Place 0.3 mg under the tongue daily as needed for spasms.   pantoprazole  (PROTONIX ) 40 MG tablet Take 1 tablet (40 mg total) by mouth 2 (two) times daily before a meal.   pseudoephedrine (SUDAFED) 30 MG tablet Take 30 mg by mouth every 8 (eight) hours as needed for congestion.   sucralfate  (CARAFATE ) 1 g tablet Take 1 tablet (1 g total) by mouth 2 (two) times daily. (Patient not taking: Reported on 01/05/2024)   No facility-administered encounter medications on file as of 01/05/2024.    Past Medical History:  Diagnosis Date   Cancer [facility])    prostate 2010   Esophageal stricture    GERD (gastroesophageal reflux disease)    H/O hiatal hernia    Hx of inguinal hernia surgery    right; will have surgery 09/13/2012    Past Surgical History:  Procedure Laterality Date   BALLOON DILATION N/A 07/16/2020   Procedure: BALLOON DILATION;  Surgeon: Elicia Claw, MD;  [facility];  Service: Gastroenterology;  Laterality: N/A;   BALLOON DILATION N/A 09/02/2020   Procedure: BALLOON DILATION;  Surgeon: Elicia Claw, MD;  [facility];  Service: Gastroenterology;  Laterality: N/A;   BIOPSY  07/16/2020   Procedure: BIOPSY;  Surgeon: Elicia Claw, MD;  [facility];  Service: Gastroenterology;;   CHOLECYSTECTOMY N/A 10/17/2015   Procedure: LAPAROSCOPIC CHOLECYSTECTOMY;  Surgeon: Donnice Lunger, MD;  [facility];  Service: General;  Laterality: N/A;   ESOPHAGOGASTRODUODENOSCOPY N/A 09/01/2012   Procedure: ESOPHAGOGASTRODUODENOSCOPY (EGD);  Surgeon: Lynwood LITTIE Celestia Mickey., MD;  Location: [facility];  Service: Endoscopy;  Laterality: N/A;  need xray   ESOPHAGOGASTRODUODENOSCOPY (EGD) WITH PROPOFOL  N/A 07/16/2020  Procedure: ESOPHAGOGASTRODUODENOSCOPY (EGD) WITH PROPOFOL  WITH DIL;  Surgeon: Elicia Claw, MD;  [facility];  Service: Gastroenterology;  Laterality: N/A;   ESOPHAGOGASTRODUODENOSCOPY (EGD) WITH PROPOFOL  N/A 09/02/2020   Procedure:  ESOPHAGOGASTRODUODENOSCOPY (EGD) WITH PROPOFOL ;  Surgeon: Elicia Claw, MD;  [facility];  Service: Gastroenterology;  Laterality: N/A;   HERNIA REPAIR  1980 s   Dr. 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 JONELLE Moats onc, removed    PROSTATECTOMY     SAVORY DILATION N/A 09/01/2012   Procedure: HARLEY DILATION;  Surgeon: Lynwood LITTIE Celestia Mickey., MD;  Location: [facility];  Service: Endoscopy;  Laterality: N/A;   TONSILLECTOMY AND ADENOIDECTOMY  1963   and adenoidectomy     Family History  Problem Relation Age of Onset   Cancer Mother        colohn ca   Parkinson's disease Father    Cancer Paternal Grandmother        colon ca    Social History   Socioeconomic History   Marital status: Married    Spouse name: Not on file   Number of children: Not on file   Years of education: Not on file   Highest education level: Not on file  Occupational History   Not on file  Tobacco Use   Smoking status: Never   Smokeless tobacco: Never  Vaping Use   Vaping status: Never Used  Substance and Sexual Activity   Alcohol use: Yes    Comment: 25/week   Drug use: No   Sexual activity: Not on file  Other Topics Concern   Not on file  Social History Narrative   Not on file   Social Drivers of Health   Financial Resource Strain: Low Risk  (01/05/2024)   Overall Financial Resource Strain (CARDIA)    Difficulty of Paying Living Expenses: Not hard at all  Food Insecurity: Low Risk  (08/25/2023)   Received from [facility]   Hunger Vital Sign    Within the past 12 months, you worried that your food would run out before you got money to buy more: Never true    Within the past 12 months, the food you bought just didn't last and you didn't have money to get more. : Never true  Transportation Needs:  No Transportation Needs (08/25/2023)   Received from [facility]    In the past 12 months, has lack of reliable transportation kept you from medical appointments, meetings, work or from getting things needed for daily living? : No  Physical Activity: Sufficiently Active (01/05/2024)   Exercise Vital Sign    Days of Exercise per Week: 7 days    Minutes of Exercise per Session: 60 min  Stress: No Stress Concern Present (01/05/2024)   [employer] - Occupational Stress Questionnaire    Feeling of Stress: Only a little  Social Connections: Not on file  Intimate Partner Violence: Not on file    ROS     Objective   BP 128/83   Pulse 64   Ht 6' (1.829 m)   Wt 190 lb 1.9 oz (86.2 kg)   SpO2 100%   BMI 25.78 kg/m   Physical Exam Gen: alert, oriented HEENT: perrla, eomi, mmm Neck: mildly decreased rom w/  lateral rotation and neck extension CV: rrr, no murmur Pulm: lctab. No wheeze or crackles.  GI: soft, nbs.  Nontender to palpation MSK: strength equal b/l. Normal gait Ext: no pedal edema.  No significant swelling or tenderness in the joints of the hands. Skin: warm and dry, no rashes Psych: pleasant affect.  Spontaneous speech     Assessment & Plan:   Encounter to establish care  Neck stiffness -     DG Cervical Spine Complete; Future  Healthcare maintenance Assessment & Plan: Patient is establishing care and is due for routine screening. History of hyperlipidemia and daily alcohol use. Family history of colon cancer. - Will obtain baseline labs including CBC, CMP, lipid panel, and A1c. - Advised to follow up with gastroenterology for repeat colonoscopy as due. No new referral needed to see Dr. Ana. - Advised to follow up with urology for continued PSA monitoring. - Discussed need for vaccinations including Tdap, pneumococcal, and shingles vaccines. Advised these are typically administered at a pharmacy under his  insurance plan. Noted importance of Tdap for protection of newborn grandchildren. - If labs are normal and no new issues arise, follow up in one year for annual physical. Will arrange earlier follow-up if results are abnormal.   Arthritis Assessment & Plan: Presents with history of polyarticular joint pain and stiffness, most notably in the hands and neck, present for several years. Physical exam reveals pain with pressure on hand joints and pain with limited range of motion in the cervical spine. - An x-ray of the cervical spine will be ordered to evaluate for degenerative changes. - Counseled on management options for arthritis pain, including topical and oral analgesics. - Discussed safety of Tylenol  and various over-the-counter topical options including NSAID gels (e.g., Voltaren), menthol/lidocaine -based products, and capsaicin cream (noting difficulty of use on hands).     Return in about 1 year (around 01/04/2025) for physical.   Toribio MARLA Slain, MD

## 2024-01-05 NOTE — Patient Instructions (Addendum)
 It was nice to see you today,  We addressed the following topics today: - I have ordered an x-ray of your neck at [facility] on [address] You can walk in during their business hours to have this done; no appointment is necessary. - I would like you to get bloodwork done here at the office. Please schedule an appointment with the front desk for next week. Our lab only does draws in the morning before 12 PM. - You can use over-the-counter topical pain relievers like Voltaren gel, Icy Hot, or Tiger Balm for your arthritis pain in your neck or other joints. - You can call Dr. Tyson office directly to schedule your next colonoscopy when you are ready. - Please go to a local pharmacy (like [facility] or [facility]) to get your Tdap (tetanus, diphtheria, pertussis), shingles, and pneumonia vaccines. - Please schedule your next annual physical for one year from now on your way out today.  Have a great day,  Rolan Slain, MD

## 2024-01-09 ENCOUNTER — Other Ambulatory Visit

## 2024-01-09 DIAGNOSIS — Z131 Encounter for screening for diabetes mellitus: Secondary | ICD-10-CM

## 2024-01-09 DIAGNOSIS — Z13220 Encounter for screening for lipoid disorders: Secondary | ICD-10-CM

## 2024-01-09 DIAGNOSIS — Z8546 Personal history of malignant neoplasm of prostate: Secondary | ICD-10-CM

## 2024-01-10 ENCOUNTER — Ambulatory Visit: Payer: Self-pay | Admitting: Family Medicine

## 2024-01-10 DIAGNOSIS — E785 Hyperlipidemia, unspecified: Secondary | ICD-10-CM

## 2024-01-10 LAB — LIPID PANEL
Chol/HDL Ratio: 3.7 ratio (ref 0.0–5.0)
Cholesterol, Total: 252 mg/dL — ABNORMAL HIGH (ref 100–199)
HDL: 69 mg/dL (ref >39)
LDL Chol Calc (NIH): 158 mg/dL — ABNORMAL HIGH (ref 0–99)
Triglycerides: 139 mg/dL (ref 0–149)
VLDL Cholesterol Cal: 25 mg/dL (ref 5–40)

## 2024-01-10 LAB — COMPREHENSIVE METABOLIC PANEL WITH GFR
ALT: 19 IU/L (ref 0–44)
AST: 21 IU/L (ref 0–40)
Albumin: 4.2 g/dL (ref 3.9–4.9)
Alkaline Phosphatase: 85 IU/L (ref 47–123)
BUN/Creatinine Ratio: 8 — ABNORMAL LOW (ref 10–24)
BUN: 9 mg/dL (ref 8–27)
Bilirubin Total: 0.8 mg/dL (ref 0.0–1.2)
CO2: 23 mmol/L (ref 20–29)
Calcium: 9.5 mg/dL (ref 8.6–10.2)
Chloride: 99 mmol/L (ref 96–106)
Creatinine, Ser: 1.07 mg/dL (ref 0.76–1.27)
Globulin, Total: 2.4 g/dL (ref 1.5–4.5)
Glucose: 91 mg/dL (ref 70–99)
Potassium: 4.7 mmol/L (ref 3.5–5.2)
Sodium: 139 mmol/L (ref 134–144)
Total Protein: 6.6 g/dL (ref 6.0–8.5)
eGFR: 76 mL/min/1.73 (ref >59)

## 2024-01-10 LAB — CBC WITH DIFFERENTIAL/PLATELET
Basophils Absolute: 0 x10E3/uL (ref 0.0–0.2)
Basos: 1 %
EOS (ABSOLUTE): 0.1 x10E3/uL (ref 0.0–0.4)
Eos: 2 %
Hematocrit: 50.7 % (ref 37.5–51.0)
Hemoglobin: 16.4 g/dL (ref 13.0–17.7)
Immature Grans (Abs): 0 x10E3/uL (ref 0.0–0.1)
Immature Granulocytes: 0 %
Lymphocytes Absolute: 1.5 x10E3/uL (ref 0.7–3.1)
Lymphs: 28 %
MCH: 27.8 pg (ref 26.6–33.0)
MCHC: 32.3 g/dL (ref 31.5–35.7)
MCV: 86 fL (ref 79–97)
Monocytes Absolute: 0.6 x10E3/uL (ref 0.1–0.9)
Monocytes: 10 %
Neutrophils Absolute: 3.2 x10E3/uL (ref 1.4–7.0)
Neutrophils: 59 %
Platelets: 318 x10E3/uL (ref 150–450)
RBC: 5.89 x10E6/uL — ABNORMAL HIGH (ref 4.14–5.80)
RDW: 13.7 % (ref 11.6–15.4)
WBC: 5.4 x10E3/uL (ref 3.4–10.8)

## 2024-01-10 LAB — HEMOGLOBIN A1C
Est. average glucose Bld gHb Est-mCnc: 114 mg/dL
Hgb A1c MFr Bld: 5.6 % (ref 4.8–5.6)

## 2024-01-10 NOTE — Progress Notes (Signed)
 Called patient he stated that you can send in the medication you think is best for him [facility] [address]

## 2024-01-11 ENCOUNTER — Other Ambulatory Visit: Payer: Self-pay | Admitting: Family Medicine

## 2024-01-11 MED ORDER — ROSUVASTATIN CALCIUM 5 MG PO TABS: 5.0000 mg | ORAL_TABLET | Freq: Every day | ORAL | 3 refills | Status: AC

## 2024-01-11 NOTE — Telephone Encounter (Signed)
 Please let the patient know I have sent in crestor  and also he needs to schedule a lab visit in 3 months to recheck his cholesterol levels.

## 2024-01-11 NOTE — Telephone Encounter (Signed)
 Called patient and scheduled the lab

## 2024-01-11 NOTE — Telephone Encounter (Addendum)
-----   Message from Toribio MARLA Slain, MD sent at 01/11/2024  6:32 AM EST -----   ----- Message ----- From: Jason Falling, RMA Sent: 01/10/2024  11:40 AM EST To: Toribio MARLA Slain, MD  Called patient he stated that you can send in the medication you think is best for him [facility] [address]  ----- Message ----- From: Slain Toribio MARLA, MD Sent: 01/10/2024   8:33 AM EST To: [facility]  Please let the pt know his cholesterol was elevated.  He would benefit from starting a statin.  I think he was prescribed atorvastatin  previously.  If he took it and tolerated it last time I can  re-order the same medication or I can send in a different one.     The 10-year ASCVD risk score (Arnett DK, et al., 2019) is: 17.8%  ----- Message ----- From: Rebecka Memos Lab Results In Sent: 01/10/2024   5:11 AM EST To: Toribio MARLA Slain, MD

## 2024-04-11 ENCOUNTER — Other Ambulatory Visit

## 2024-05-31 ENCOUNTER — Ambulatory Visit

## 2025-01-07 ENCOUNTER — Encounter: Admitting: Family Medicine
